# Patient Record
Sex: Female | Born: 1995 | Race: White | Hispanic: No | Marital: Married | Smoking: Never smoker
Health system: Southern US, Community
[De-identification: ages and names within clinical notes are randomized; demographics above are authoritative.]

## PROBLEM LIST (undated history)

## (undated) DIAGNOSIS — G43909 Migraine, unspecified, not intractable, without status migrainosus: Secondary | ICD-10-CM

## (undated) DIAGNOSIS — B009 Herpesviral infection, unspecified: Secondary | ICD-10-CM

## (undated) DIAGNOSIS — T7840XA Allergy, unspecified, initial encounter: Secondary | ICD-10-CM

## (undated) DIAGNOSIS — J45990 Exercise induced bronchospasm: Secondary | ICD-10-CM

## (undated) DIAGNOSIS — D649 Anemia, unspecified: Secondary | ICD-10-CM

## (undated) DIAGNOSIS — T753XXA Motion sickness, initial encounter: Secondary | ICD-10-CM

## (undated) DIAGNOSIS — Z973 Presence of spectacles and contact lenses: Secondary | ICD-10-CM

## (undated) HISTORY — DX: Allergy, unspecified, initial encounter: T78.40XA

## (undated) HISTORY — DX: Herpesviral infection, unspecified: B00.9

## (undated) HISTORY — PX: WISDOM TOOTH EXTRACTION: SHX21

---
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
## 2016-01-01 ENCOUNTER — Telehealth: Payer: Self-pay | Admitting: *Deleted

## 2016-01-01 ENCOUNTER — Encounter: Payer: Self-pay | Admitting: Podiatry

## 2016-01-01 MED ORDER — CEPHALEXIN 500 MG PO CAPS: 500.0000 mg | ORAL_CAPSULE | Freq: Four times a day (QID) | ORAL | 0 refills | Status: DC

## 2016-01-01 NOTE — Telephone Encounter (Signed)
Informed pt that Dr. Ardelle AntonWagoner changed the antibiotic to Keflex 500mg  #42 one capsule 3 times daily, and to make an appt to come in this week. Orders to pt and tranferred to schedulers.

## 2016-01-02 ENCOUNTER — Telehealth: Payer: Self-pay | Admitting: *Deleted

## 2016-01-02 ENCOUNTER — Encounter: Payer: Self-pay | Admitting: Podiatry

## 2016-01-02 ENCOUNTER — Ambulatory Visit (HOSPITAL_BASED_OUTPATIENT_CLINIC_OR_DEPARTMENT_OTHER)
Admission: RE | Admit: 2016-01-02 | Discharge: 2016-01-02 | Disposition: A | Discharge status: Home / Self Care | Payer: Managed Care, Other (non HMO) | Source: Ambulatory Visit | Attending: Podiatry | Admitting: Podiatry

## 2016-01-02 ENCOUNTER — Ambulatory Visit (INDEPENDENT_AMBULATORY_CARE_PROVIDER_SITE_OTHER): Payer: Managed Care, Other (non HMO) | Admitting: Podiatry

## 2016-01-02 VITALS — BP 107/69 | HR 94 | Temp 98.0°F | Resp 18

## 2016-01-02 DIAGNOSIS — A6920 Lyme disease, unspecified: Secondary | ICD-10-CM

## 2016-01-02 DIAGNOSIS — L03116 Cellulitis of left lower limb: Secondary | ICD-10-CM | POA: Diagnosis not present

## 2016-01-02 DIAGNOSIS — L02619 Cutaneous abscess of unspecified foot: Secondary | ICD-10-CM

## 2016-01-02 DIAGNOSIS — L03119 Cellulitis of unspecified part of limb: Principal | ICD-10-CM

## 2016-01-02 DIAGNOSIS — R52 Pain, unspecified: Secondary | ICD-10-CM | POA: Diagnosis not present

## 2016-01-02 MED ORDER — CIPROFLOXACIN HCL 500 MG PO TABS: 500.0000 mg | ORAL_TABLET | Freq: Two times a day (BID) | ORAL | 0 refills | Status: DC

## 2016-01-02 NOTE — Telephone Encounter (Signed)
Pt called and is scheduled to see Dr Ardelle [place]Wagoner [place] today.(10.31.17)

## 2016-01-02 NOTE — Progress Notes (Signed)
Subjective: 20 year old female presents the office of the grandmother for follow-up evaluation and continued redness Cindy Hart to her left foot. She states that the 2 days ago she was doing better. He has resolved almost completely however over the last day she started and is increase in swelling in her toes are becoming red and painful again. She is continuing with a surgical shoe. She called yesterday switch her over to Keflex and she stop the Bactrim. She states that she feels a little flush but she does not have any fevers or chills or any nausea or vomiting. She has no other complaints at this time.   Objective: AAO x3, NAD DP/PT pulses palpable bilaterally, CRT less than 3 seconds On the left foot there is edema and erythema to the forefoot encompassing digits 1-5 to the mid level of the metatarsal. There is no ascending cellulitis. There is diffuse tenderness mostly along the big toe along the lateral aspect. There is one area of what appears to be an area of puncture wound for possible bug bite which is healed over. There is no drainage or pus expressed. I'm unable to identify any areas of fluctuance or crepitus or any open sore otherwise. No open lesions or pre-ulcerative lesions.  No pain with calf compression, swelling, warmth, erythema  Assessment: Worsening cellulitis left foot  Plan: -All treatment options discussed with the patient including all alternatives, risks, complications.  -Repeat x-rays were obtained today. Unable to identify any osteomyelitis or soft tissue emphysema -Given longevity his symptoms as well as multiple antibiotics I recommended MRI of the left foot to rule out an abscess. This was ordered today to be done as soon as possible. -Will add Cipro to the Keflex. Discussed that there is any worsening of the erythema or swelling she is to go to the emergency room for IV antibiotics and she verbally understood this. -Also order blood work for Lyme disease as  they were concerned about this as well. -Follow up in 1 week or sooner if needed. -Patient encouraged to call the office with any questions, concerns, change in symptoms.   Cindy CurdMatthew Wagoner, DPM

## 2016-01-02 NOTE — Patient Instructions (Signed)
I ordered an MRI of your left foot with contrast to rule out an abscess. Please call [phone] and speak with Joya SanValery to follow-up with this.   I sent cipro to your pharmacy to start as well  I ordered a blood test to check for lyme disease.   If there is any worsening, go to the ER for likely IV antibiotics

## 2016-01-02 NOTE — Telephone Encounter (Addendum)
Dr. Ardelle AntonWagoner ordered stat MRI with and without contrast of Left foot evaluate abscess of forefoot.[employer] - Pre-cert dept.Wilmon Arms-Matthew K., states clinicals are required for pre-cert, I requested to speak with a Nurse Reviewer to give emergent clinicals, Wilmon ArmsMatthew K. States his prompts will not allow, must fax clinicals to [phone]. Faxed required information to [employer]. 01/04/2016-[employer] APPROVED MRI [id number] LEFT FOOT WITH/WITHOUT CONTRAST, AUTHORIZATION NUMBER: [id number], AUTHORIZATION EFFECTIVE DATES(S):  01/02/2016 THROUGH 04/01/2016. [facility]ax[facility] authorization to [facility]. Unable to leave message informing pt the MRI had been approved, "mailbox is full". Left message to call [facility]. 01/04/2016-Donna, pt's mtr states pt's foot worsened and had to go to the ER, [facility] at 2[facility]00[facility]. Left message requesting pt's current status and treatment received at ER. Dr. Ardelle AntonWagoner states he would like Dr Logan BoresEvans to see pt tomorrow for 2nd opinion. Unable to leave message for pt to make an appt for 01/05/2016 with Dr. Logan BoresEvans, mailbox was full. Left message on pt's mtr, Donna's voicemail to call to make an appt 12/05/2015 with Dr. Logan BoresEvans. Left message on pt's home phone to make an appt with Dr.Evans tomorrow.

## 2016-01-03 ENCOUNTER — Emergency Department
Admission: EM | Admit: 2016-01-03 | Discharge: 2016-01-03 | Disposition: A | Discharge status: Home / Self Care | Payer: Managed Care, Other (non HMO) | Attending: Emergency Medicine | Admitting: Emergency Medicine

## 2016-01-03 DIAGNOSIS — L03116 Cellulitis of left lower limb: Secondary | ICD-10-CM

## 2016-01-03 DIAGNOSIS — M7989 Other specified soft tissue disorders: Secondary | ICD-10-CM | POA: Diagnosis present

## 2016-01-03 DIAGNOSIS — J45909 Unspecified asthma, uncomplicated: Secondary | ICD-10-CM | POA: Diagnosis not present

## 2016-01-03 DIAGNOSIS — Z7951 Long term (current) use of inhaled steroids: Secondary | ICD-10-CM | POA: Insufficient documentation

## 2016-01-03 DIAGNOSIS — Z79899 Other long term (current) drug therapy: Secondary | ICD-10-CM | POA: Insufficient documentation

## 2016-01-03 LAB — CBC WITH DIFFERENTIAL/PLATELET
BASOS PCT: 1 %
Basophils Absolute: 0.1 10*3/uL (ref 0–0.1)
EOS ABS: 0.1 10*3/uL (ref 0–0.7)
Eosinophils Relative: 1 %
HEMATOCRIT: 33.3 % — AB (ref 35.0–47.0)
HEMOGLOBIN: 10.8 g/dL — AB (ref 12.0–16.0)
LYMPHS ABS: 1.9 10*3/uL (ref 1.0–3.6)
Lymphocytes Relative: 22 %
MCH: 23.2 pg — ABNORMAL LOW (ref 26.0–34.0)
MCHC: 32.4 g/dL (ref 32.0–36.0)
MCV: 71.4 fL — ABNORMAL LOW (ref 80.0–100.0)
MONOS PCT: 9 %
Monocytes Absolute: 0.7 10*3/uL (ref 0.2–0.9)
NEUTROS ABS: 5.8 10*3/uL (ref 1.4–6.5)
NEUTROS PCT: 67 %
Platelets: 244 10*3/uL (ref 150–440)
RBC: 4.66 MIL/uL (ref 3.80–5.20)
RDW: 16.1 % — ABNORMAL HIGH (ref 11.5–14.5)
WBC: 8.6 10*3/uL (ref 3.6–11.0)

## 2016-01-03 LAB — BASIC METABOLIC PANEL
ANION GAP: 5 (ref 5–15)
BUN: 8 mg/dL (ref 6–20)
CALCIUM: 8.8 mg/dL — AB (ref 8.9–10.3)
CHLORIDE: 105 mmol/L (ref 101–111)
CO2: 25 mmol/L (ref 22–32)
CREATININE: 0.73 mg/dL (ref 0.44–1.00)
GFR calc non Af Amer: >60 mL/min (ref >60)
Glucose, Bld: 88 mg/dL (ref 65–99)
Potassium: 4.1 mmol/L (ref 3.5–5.1)
SODIUM: 135 mmol/L (ref 135–145)

## 2016-01-03 MED ORDER — CLINDAMYCIN HCL 300 MG PO CAPS: 300.0000 mg | ORAL_CAPSULE | Freq: Three times a day (TID) | ORAL | 0 refills | Status: AC

## 2016-01-03 MED ORDER — CLINDAMYCIN PHOSPHATE 600 MG/50ML IV SOLN
600.0000 mg | Freq: Once | INTRAVENOUS | Status: AC
  Administered 2016-01-03: 600 mg via INTRAVENOUS
  Filled 2016-01-03: qty 50

## 2016-01-03 NOTE — ED Notes (Signed)
Patient here with cellulitis to right foot. Patient states this started 3 weeks ago. Unknown origin. Patient states that she may have gotten bit by something, small black dot noted to inside of right great toe. Edema and redness noted to right foot. Redness starts at the middle of the foot and extends into all digits. Patient able to ambulate with boot on affected foot. Patient was on 2 weeks of bactrim. Patient is now on cipro and keflex.

## 2016-01-03 NOTE — ED Provider Notes (Signed)
[facility] Emergency Department Provider Note ____________________________________________  Time seen: 15:26  I have reviewed the triage vital signs and the nursing notes.  HISTORY  Chief Complaint  Cellulitis  HPI Cindy Hart is a 20 y.o. female with no significant medical history, presents to the ED for continued swelling, pain, and redness to her left foot for three weeks with unknown origin.  Three weeks ago she woke up with redness, pain and edema in her first web space on her left foot. [facility] and [facility] prescribed her bactrim and prednisone.  Patient did not like the prednisone and went to her PCP.  Her PCP drew labs and the WBC count was 18 so she placed the patient was additionally on Keflex. She obtained an x-ray of her left foot and it came back unremarkable.  She stopped the Bactrim and was just taking the Keflex. The PCP referred the patient to see Dr. Ardelle AntonWagoner (podiatrist).  At her first visit with Dr. Ardelle AntonWagoner he took another x-ray and did more labs that all came back normal.  The WBC count came down to 10, but the redness and swelling had spread now to the rest of her toes and 1/3 way up her foot. Dr. Ardelle AntonWagoner told patient to continue Keflex and follow up in a week.  The redness, pain, and swelling got worse so patient went to see Dr. Ardelle AntonWagoner yesterday (01/02/16) and he placed her on Cipro on top of the keflex.  He told her to go to the ER if the redness and swelling got worse the next morning, which it did so she came to the ED today.  Patient denies any trauma, fever, rash, arthralgias, any exposure to insects or ticks, or ever experiencing this before.  Admits to warm to touch, swelling, erythema, and pain especially with walking.  Pain is a 7/10 intensity.   No past medical history on file.  Patient Active Problem List   Diagnosis Date Noted  . Cellulitis of left lower extremity 01/02/2016  . Asthma, intermittent 12/05/2014     No past surgical history on file.  Prior to Admission medications   Medication Sig Start Date End Date Taking? Authorizing Provider  cephALEXin (KEFLEX) 500 MG capsule Take 1 capsule (500 mg total) by mouth 4 (four) times daily. 01/01/16   Vivi BarrackMatthew R Wagoner, DPM  ciprofloxacin (CIPRO) 500 MG tablet Take 1 tablet (500 mg total) by mouth 2 (two) times daily. 01/02/16   Vivi BarrackMatthew R Wagoner, DPM  clindamycin (CLEOCIN) 300 MG capsule Take 1 capsule (300 mg total) by mouth 3 (three) times daily. 01/03/16 01/13/16  Damika Harmon V Bacon Matsue Strom, PA-C  fluticasone (FLONASE) 50 MCG/ACT nasal spray Place 2 sprays into both nostrils daily. 07/20/15   Faythe GheeSusan W Fisher, PA-C  Norgestimate-Ethinyl Estradiol Triphasic (TRI-PREVIFEM) 0.18/0.215/0.25 MG-35 MCG tablet Take by mouth. 08/24/14   Historical Provider, MD  sulfamethoxazole-trimethoprim (BACTRIM DS,SEPTRA DS) 800-160 MG tablet Take 1 tablet by mouth 2 (two) times daily. 12/28/15   Vivi BarrackMatthew R Wagoner, DPM  valACYclovir (VALTREX) 1000 MG tablet TAKE 1 TABLET BY MOUTH TWICE A DAY AS DIRECTED 10/10/14   Historical Provider, MD   Allergies Review of patient's allergies indicates no known allergies.  No family history on file.  Social History Social History  Substance Use Topics  . Smoking status: Never Smoker  . Smokeless tobacco: Never Used  . Alcohol use Not on file   Review of Systems  Constitutional: Negative for fever. Cardiovascular: Negative  for chest pain. Respiratory: Negative for shortness of breath or cough. Gastrointestinal: Negative for abdominal pain, vomiting and diarrhea. Musculoskeletal: Negative for back pain. Skin: admits to erythema and edema to left foot from the toes up to the middle of the foot.  Neurological: Negative for headaches, focal weakness or numbness. ____________________________________________  PHYSICAL EXAM:  VITAL SIGNS: ED Triage Vitals  Enc Vitals Group     BP 01/03/16 1255 (!) 131/91     Pulse Rate 01/03/16  1255 91     Resp 01/03/16 1255 18     Temp 01/03/16 1255 98.2 F (36.8 C)     Temp Source 01/03/16 1255 Oral     SpO2 01/03/16 1255 99 %     Weight 01/03/16 1256 125 lb (56.7 kg)     Height 01/03/16 1256 5\' 7"  (1.702 m)     Head Circumference --      Peak Flow --      Pain Score 01/03/16 1256 7     Pain Loc --      Pain Edu? --      Excl. in GC? --    Constitutional: Alert and oriented. Well appearing and in no distress. Head: Normocephalic and atraumatic. Cardiovascular: Normal rate, regular rhythm. Normal distal pulses. DP and PT 2+ BIL.  Respiratory: Normal respiratory effort. No wheezes/rales/rhonchi. Gastrointestinal: Soft and nontender. No distention. Musculoskeletal: Nontender with normal range of motion in all extremities.  Neurologic:  Normal gait without ataxia. Normal speech and language. No gross focal neurologic deficits are appreciated. Skin:  Erythematous and edematous left foot from the toes up to the middle of the foot. Warm to touch.  No clear demarcations. No clear entry point.  Psychiatric: Mood and affect are normal. Patient exhibits appropriate insight and judgment. ____________________________________________   LABS (pertinent positives/negatives) Labs Reviewed  BASIC METABOLIC PANEL - Abnormal; Notable for the following:       Result Value   Calcium 8.8 (*)    All other components within normal limits  CBC WITH DIFFERENTIAL/PLATELET - Abnormal; Notable for the following:    Hemoglobin 10.8 (*)    HCT 33.3 (*)    MCV 71.4 (*)    MCH 23.2 (*)    RDW 16.1 (*)    All other components within normal limits  B. BURGDORFI ANTIBODIES  ____________________________________________  PROCEDURES  Clindamycin 600mg  IVP  ____________________________________________  INITIAL IMPRESSION / ASSESSMENT AND PLAN / ED COURSE  Cindy Hart is a 20 year old female presenting to the ED with cellulitis to her left foot for three weeks.  She has been placed on bactrim,  keflex, and cipro with no relief of symptoms or improvement. She denies fever and trauma. Labs today showed no indication of an ongoing infection.  Educated patient to discontinue keflex and cipro and prescribed patient one dose of IV clindamycin 600 mg while in the ED and then discharge home with clindamycin 300mg  PO TID for 10 days.  Follow-up with podiatrist in the next week if symptoms do not improve or unless otherwise notified.   Clinical Course   ____________________________________________  FINAL CLINICAL IMPRESSION(S) / ED DIAGNOSES  Final diagnoses:  Cellulitis of foot, left     Lissa HoardJenise V Bacon Zuzu Befort, PA-C 01/03/16 1752    Loleta Roseory Forbach, MD 01/03/16 2012

## 2016-01-03 NOTE — ED Notes (Signed)
Pt verbalized understanding of discharge instructions. NAD at this time. 

## 2016-01-03 NOTE — ED Triage Notes (Signed)
Pt arrives ambulatory to triage with reports of left sided foot pain that has been ongoing for three weeks  She was seen by Greig RightSusan Fisher and started on abx and then she was seen by a specialist at [facility] and started on more abx but pt reports that her edema and pain is not any better

## 2016-01-03 NOTE — Discharge Instructions (Signed)
Labs show no indication of ongoing infection.  Take Clindamycin three times daily for ten days.  Follow up with Podiatry for close monitoring and for effectiveness of clindamycin.  Come back to the ED if the redness and swelling start to streak up the leg or if there is SEVERE pain in the left foot.

## 2016-01-04 ENCOUNTER — Ambulatory Visit (HOSPITAL_COMMUNITY)
Admission: RE | Admit: 2016-01-04 | Discharge: 2016-01-04 | Disposition: A | Discharge status: Home / Self Care | Payer: Managed Care, Other (non HMO) | Source: Ambulatory Visit | Attending: Podiatry | Admitting: Podiatry

## 2016-01-04 DIAGNOSIS — L03119 Cellulitis of unspecified part of limb: Secondary | ICD-10-CM | POA: Diagnosis present

## 2016-01-04 DIAGNOSIS — L02619 Cutaneous abscess of unspecified foot: Secondary | ICD-10-CM

## 2016-01-04 DIAGNOSIS — L03116 Cellulitis of left lower limb: Secondary | ICD-10-CM | POA: Insufficient documentation

## 2016-01-04 MED ORDER — GADOBENATE DIMEGLUMINE 529 MG/ML IV SOLN
11.0000 mL | Freq: Once | INTRAVENOUS | Status: AC | PRN
  Administered 2016-01-04: 11 mL via INTRAVENOUS

## 2016-01-04 NOTE — Telephone Encounter (Signed)
Can you have her see Dr. Logan BoresEvans tomorrow? I want a second opinion on this. Thanks.

## 2016-01-05 ENCOUNTER — Ambulatory Visit: Payer: Managed Care, Other (non HMO) | Admitting: Podiatry

## 2016-01-05 LAB — LYME, WESTERN BLOT, SERUM (REFLEXED)
IGG P18 AB.: ABSENT
IGG P28 AB.: ABSENT
IGG P30 AB.: ABSENT
IGG P58 AB.: ABSENT
IGG P93 AB.: ABSENT
IGM P23 AB.: ABSENT
IgG P23 Ab.: ABSENT
IgG P66 Ab.: ABSENT
LYME IGG WB: NEGATIVE
Lyme IgM Wb: POSITIVE — AB

## 2016-01-05 LAB — B. BURGDORFI ANTIBODIES: B burgdorferi Ab IgG+IgM: 1.72 {ISR} — ABNORMAL HIGH (ref 0.00–0.90)

## 2016-01-06 ENCOUNTER — Telehealth: Payer: Self-pay | Admitting: Emergency Medicine

## 2016-01-06 NOTE — Telephone Encounter (Signed)
Called pt [phone]) back due to positive Lyme disease test results.  Per Dr. Derrill KayGoodman, call in doxycycline 100mg  BID x 21 days total #42 NO REFILLS.  Pt made aware, [facility] [address] [phone].

## 2016-01-07 ENCOUNTER — Telehealth (HOSPITAL_BASED_OUTPATIENT_CLINIC_OR_DEPARTMENT_OTHER): Payer: Self-pay

## 2016-01-11 ENCOUNTER — Ambulatory Visit: Payer: Managed Care, Other (non HMO) | Admitting: Podiatry

## 2016-02-23 ENCOUNTER — Ambulatory Visit: Payer: Self-pay | Admitting: Physician Assistant

## 2016-02-23 ENCOUNTER — Encounter: Payer: Self-pay | Admitting: Physician Assistant

## 2016-02-23 VITALS — BP 100/70 | HR 90 | Temp 98.5°F | Ht 67.0 in | Wt 130.0 lb

## 2016-02-23 DIAGNOSIS — Z0000 Encounter for general adult medical examination without abnormal findings: Secondary | ICD-10-CM

## 2016-02-23 DIAGNOSIS — Z299 Encounter for prophylactic measures, unspecified: Secondary | ICD-10-CM

## 2016-02-23 NOTE — Progress Notes (Signed)
S: here for wellness check, states no complaints, says her foot infection ended up being lymes disease, was given 21 days of doxy, now foot is better, will only get red when gets out of the shower, ros neg  O: vitals wnl, nad, ENT wnl, neck supple no lymph, lungs c t a, cv rrr, abd soft nontender bs normal all 4 quads, neuro intact, gyn deferred to gynecologist  A: well adult  P: yearly screening labs

## 2016-02-24 LAB — CMP12+LP+TP+TSH+6AC+CBC/D/PLT
A/G RATIO: 1.7 (ref 1.2–2.2)
ALT: 27 IU/L (ref 0–32)
AST: 31 IU/L (ref 0–40)
Albumin: 4.1 g/dL (ref 3.5–5.5)
Alkaline Phosphatase: 68 IU/L (ref 39–117)
BASOS ABS: 0 10*3/uL (ref 0.0–0.2)
BILIRUBIN TOTAL: 0.3 mg/dL (ref 0.0–1.2)
BUN / CREAT RATIO: 8 — AB (ref 9–23)
BUN: 6 mg/dL (ref 6–20)
Basos: 1 %
CHOL/HDL RATIO: 3.6 ratio (ref 0.0–4.4)
Calcium: 8.9 mg/dL (ref 8.7–10.2)
Chloride: 105 mmol/L (ref 96–106)
Cholesterol, Total: 155 mg/dL (ref 100–169)
Creatinine, Ser: 0.74 mg/dL (ref 0.57–1.00)
EOS (ABSOLUTE): 0.1 10*3/uL (ref 0.0–0.4)
EOS: 2 %
Estimated CHD Risk: 0.6 times avg.
Free Thyroxine Index: 1.4 (ref 1.2–4.9)
GFR calc non Af Amer: 118 mL/min/{1.73_m2} (ref >59)
GFR, EST AFRICAN AMERICAN: 136 mL/min/{1.73_m2} (ref >59)
GGT: 7 IU/L (ref 0–60)
GLOBULIN, TOTAL: 2.4 g/dL (ref 1.5–4.5)
GLUCOSE: 80 mg/dL (ref 65–99)
HDL: 43 mg/dL (ref >39)
HEMOGLOBIN: 10.2 g/dL — AB (ref 11.1–15.9)
Hematocrit: 32.4 % — ABNORMAL LOW (ref 34.0–46.6)
IMMATURE GRANS (ABS): 0 10*3/uL (ref 0.0–0.1)
IMMATURE GRANULOCYTES: 0 %
IRON: 22 ug/dL — AB (ref 27–159)
LDH: 146 IU/L (ref 119–226)
LDL Calculated: 91 mg/dL (ref 0–109)
LYMPHS: 38 %
Lymphocytes Absolute: 2.6 10*3/uL (ref 0.7–3.1)
MCH: 21.7 pg — ABNORMAL LOW (ref 26.6–33.0)
MCHC: 31.5 g/dL (ref 31.5–35.7)
MCV: 69 fL — ABNORMAL LOW (ref 79–97)
MONOCYTES: 8 %
Monocytes Absolute: 0.6 10*3/uL (ref 0.1–0.9)
Neutrophils Absolute: 3.5 10*3/uL (ref 1.4–7.0)
Neutrophils: 51 %
PHOSPHORUS: 3.2 mg/dL (ref 2.5–5.3)
PLATELETS: 279 10*3/uL (ref 150–379)
Potassium: 4.1 mmol/L (ref 3.5–5.2)
RBC: 4.71 x10E6/uL (ref 3.77–5.28)
RDW: 15.8 % — AB (ref 12.3–15.4)
Sodium: 141 mmol/L (ref 134–144)
T3 UPTAKE RATIO: 18 % — AB (ref 24–39)
T4 TOTAL: 7.7 ug/dL (ref 4.5–12.0)
TRIGLYCERIDES: 104 mg/dL — AB (ref 0–89)
TSH: 3.54 u[IU]/mL (ref 0.450–4.500)
Total Protein: 6.5 g/dL (ref 6.0–8.5)
URIC ACID: 4 mg/dL (ref 2.5–7.1)
VLDL CHOLESTEROL CAL: 21 mg/dL (ref 5–40)
WBC: 6.8 10*3/uL (ref 3.4–10.8)

## 2016-02-24 LAB — VITAMIN D 25 HYDROXY (VIT D DEFICIENCY, FRACTURES): VIT D 25 HYDROXY: 42.9 ng/mL (ref 30.0–100.0)

## 2016-03-04 DIAGNOSIS — D649 Anemia, unspecified: Secondary | ICD-10-CM

## 2016-03-04 HISTORY — DX: Anemia, unspecified: D64.9

## 2016-03-21 ENCOUNTER — Other Ambulatory Visit: Payer: Self-pay

## 2016-03-22 ENCOUNTER — Ambulatory Visit: Payer: Self-pay | Admitting: Physician Assistant

## 2016-03-22 ENCOUNTER — Encounter: Payer: Self-pay | Admitting: Physician Assistant

## 2016-03-22 ENCOUNTER — Other Ambulatory Visit: Payer: Self-pay | Admitting: Emergency Medicine

## 2016-03-22 DIAGNOSIS — E611 Iron deficiency: Secondary | ICD-10-CM

## 2016-03-22 MED ORDER — VALACYCLOVIR HCL 1 G PO TABS: ORAL_TABLET | ORAL | 0 refills | Status: DC

## 2016-03-22 NOTE — Progress Notes (Signed)
Patient came in to have her blood rechecked per Susan's authorization.  Patient also expressed that she needs to get a refill for her Valtrex.

## 2016-03-23 LAB — CBC WITH DIFFERENTIAL/PLATELET
BASOS ABS: 0.1 10*3/uL (ref 0.0–0.2)
Basos: 1 %
EOS (ABSOLUTE): 0.1 10*3/uL (ref 0.0–0.4)
EOS: 2 %
HEMATOCRIT: 34.7 % (ref 34.0–46.6)
Hemoglobin: 10.6 g/dL — ABNORMAL LOW (ref 11.1–15.9)
Immature Grans (Abs): 0 10*3/uL (ref 0.0–0.1)
Immature Granulocytes: 0 %
LYMPHS ABS: 2 10*3/uL (ref 0.7–3.1)
Lymphs: 26 %
MCH: 22.3 pg — ABNORMAL LOW (ref 26.6–33.0)
MCHC: 30.5 g/dL — AB (ref 31.5–35.7)
MCV: 73 fL — ABNORMAL LOW (ref 79–97)
MONOS ABS: 0.7 10*3/uL (ref 0.1–0.9)
Monocytes: 9 %
NEUTROS PCT: 62 %
Neutrophils Absolute: 4.8 10*3/uL (ref 1.4–7.0)
PLATELETS: 324 10*3/uL (ref 150–379)
RBC: 4.76 x10E6/uL (ref 3.77–5.28)
RDW: 16.7 % — AB (ref 12.3–15.4)
WBC: 7.7 10*3/uL (ref 3.4–10.8)

## 2016-03-23 LAB — IRON: Iron: 20 ug/dL — ABNORMAL LOW (ref 27–159)

## 2016-03-27 NOTE — Progress Notes (Signed)
Per Susan's authorization I referred the patient to [facility].

## 2016-03-27 NOTE — Addendum Note (Signed)
Addended by: Catha BrowEACON, MONIQUE T on: 03/27/2016 02:56 PM   Modules accepted: Orders

## 2016-04-10 DIAGNOSIS — D509 Iron deficiency anemia, unspecified: Secondary | ICD-10-CM | POA: Insufficient documentation

## 2016-04-10 NOTE — Progress Notes (Signed)
[facility]  Telephone:(336) [phone] Fax:(336) [phone]  ID: Cindy Hart OB: 03/22/1995  MR#: [id number]  ION#:[id number]#:[id number]  Patient Care Team: Bartholomew BoardsSusan W Fisher, PA-C as PCP - General (Physician Assistant)  CHIEF COMPLAINT:  Iron deficiency anemia.  INTERVAL HISTORY: Patient is a 21 year old female who was noted to have significant iron deficiency anemia on routine blood work during workup for Lyme disease. She has increased weakness and fatigue, particularly towards the end of the day. She also has noted some dyspnea on exertion. She otherwise feels well. She has no neurologic complaints. She denies any recent fevers or illnesses. She denies any chest pain or hemoptysis. She has no nausea, vomiting, constipation, or diarrhea. She denies any melena or hematochezia. She has no urinary complaints. Patient otherwise feels well and offers no further specific complaints.  REVIEW OF SYSTEMS:   Review of Systems  Constitutional: Positive for malaise/fatigue. Negative for fever and weight loss.  Respiratory: Positive for shortness of breath. Negative for cough and hemoptysis.   Cardiovascular: Negative.  Negative for chest pain and leg swelling.  Gastrointestinal: Negative.  Negative for abdominal pain and blood in stool.  Genitourinary: Negative.   Musculoskeletal: Negative.   Neurological: Positive for weakness.  Psychiatric/Behavioral: Negative.  The patient is not nervous/anxious.     As per HPI. Otherwise, a complete review of systems is negative.  PAST MEDICAL HISTORY: Past Medical History:  Diagnosis Date  . HSV-1 (herpes simplex virus 1) infection     PAST SURGICAL HISTORY: History reviewed. No pertinent surgical history.  FAMILY HISTORY: Family History  Problem Relation Age of Onset  . Thyroid disease Mother   . Diabetes Maternal Grandmother   . Heart disease Maternal Grandmother   . Hypertension Maternal Grandfather   . Atrial fibrillation Paternal  Grandmother     ADVANCED DIRECTIVES (Y/N):  N  HEALTH MAINTENANCE: Social History  Substance Use Topics  . Smoking status: Never Smoker  . Smokeless tobacco: Never Used  . Alcohol use No     Colonoscopy:  PAP:  Bone density:  Lipid panel:  No Known Allergies  Current Outpatient Prescriptions  Medication Sig Dispense Refill  . Ferrous Sulfate (IRON) 325 (65 Fe) MG TABS Take by mouth.    . Multiple Vitamins-Minerals (MULTIVITAMIN ADULTS PO) Take by mouth.    Lorita Officer. Norgestim-Eth Estrad Triphasic (TRI-PREVIFEM PO) Take by mouth.    . L-Lysine 1000 MG TABS Take 1 tablet (1,000 mg total) by mouth daily. (Patient not taking: Reported on 04/11/2016) 28 tablet 11  . valACYclovir (VALTREX) 1000 MG tablet TAKE 1 TABLET BY MOUTH TWICE A DAY AS DIRECTED (Patient not taking: Reported on 04/11/2016) 20 tablet 0   No current facility-administered medications for this visit.     OBJECTIVE: Vitals:   04/11/16 1505  BP: 124/75  Resp: 18  Temp: 98.7 F (37.1 C)     Body mass index is 20.87 kg/m.    ECOG FS:0 - Asymptomatic  General: Well-developed, well-nourished, no acute distress. Eyes: Pink conjunctiva, anicteric sclera. HEENT: Normocephalic, moist mucous membranes, clear oropharnyx. Lungs: Clear to auscultation bilaterally. Heart: Regular rate and rhythm. No rubs, murmurs, or gallops. Abdomen: Soft, nontender, nondistended. No organomegaly noted, normoactive bowel sounds. Musculoskeletal: No edema, cyanosis, or clubbing. Neuro: Alert, answering all questions appropriately. Cranial nerves grossly intact. Skin: No rashes or petechiae noted. Psych: Normal affect. Lymphatics: No cervical, calvicular, axillary or inguinal LAD.   LAB RESULTS:  Lab Results  Component Value Date   NA 141 02/23/2016  K 4.1 02/23/2016   CL 105 02/23/2016   CO2 25 01/03/2016   GLUCOSE 80 02/23/2016   BUN 6 02/23/2016   CREATININE 0.74 02/23/2016   CALCIUM 8.9 02/23/2016   PROT 6.5 02/23/2016    ALBUMIN 4.1 02/23/2016   AST 31 02/23/2016   ALT 27 02/23/2016   ALKPHOS 68 02/23/2016   BILITOT 0.3 02/23/2016   GFRNONAA 118 02/23/2016   GFRAA 136 02/23/2016    Lab Results  Component Value Date   WBC 7.7 03/22/2016   NEUTROABS 4.8 03/22/2016   HGB 10.8 (L) 01/03/2016   HCT 34.7 03/22/2016   MCV 73 (L) 03/22/2016   PLT 324 03/22/2016   Lab Results  Component Value Date   IRON 20 (L) 03/22/2016   No results found for: FERRITIN  STUDIES: No results found.  ASSESSMENT: Iron deficiency anemia.  PLAN:    1.  Iron deficiency anemia:  Patient's hemoglobin, MCV, and iron stores are decreased and she is symptomatic. She will benefit from IV iron. Return to clinic in 1 and 2 weeks to receive 510 mg of IV Feraheme. Patient will then return to clinic in 3 months with repeat laboratory work and further evaluation. If she does not have an improvement in her hemoglobin at that point, will consider full anemia workup. 2. Lyme disease: Continue treatment and monitoring her primary care.  Patient expressed understanding and was in agreement with this plan. She also understands that She can call clinic at any time with any questions, concerns, or complaints.    Jeralyn Ruths, MD   04/14/2016 8:28 PM

## 2016-04-11 ENCOUNTER — Encounter: Payer: Self-pay | Admitting: Oncology

## 2016-04-11 ENCOUNTER — Encounter: Payer: Self-pay | Admitting: Obstetrics and Gynecology

## 2016-04-11 ENCOUNTER — Ambulatory Visit (INDEPENDENT_AMBULATORY_CARE_PROVIDER_SITE_OTHER): Payer: Managed Care, Other (non HMO) | Admitting: Obstetrics and Gynecology

## 2016-04-11 ENCOUNTER — Inpatient Hospital Stay: Payer: Managed Care, Other (non HMO) | Attending: Oncology | Admitting: Oncology

## 2016-04-11 ENCOUNTER — Other Ambulatory Visit: Payer: Self-pay | Admitting: Obstetrics and Gynecology

## 2016-04-11 VITALS — BP 108/68 | HR 88 | Ht 67.0 in | Wt 132.0 lb

## 2016-04-11 DIAGNOSIS — B009 Herpesviral infection, unspecified: Secondary | ICD-10-CM | POA: Insufficient documentation

## 2016-04-11 DIAGNOSIS — Z01419 Encounter for gynecological examination (general) (routine) without abnormal findings: Secondary | ICD-10-CM | POA: Diagnosis not present

## 2016-04-11 DIAGNOSIS — Z79899 Other long term (current) drug therapy: Secondary | ICD-10-CM | POA: Diagnosis not present

## 2016-04-11 DIAGNOSIS — A6920 Lyme disease, unspecified: Secondary | ICD-10-CM | POA: Diagnosis not present

## 2016-04-11 DIAGNOSIS — D509 Iron deficiency anemia, unspecified: Secondary | ICD-10-CM | POA: Diagnosis not present

## 2016-04-11 DIAGNOSIS — D508 Other iron deficiency anemias: Secondary | ICD-10-CM

## 2016-04-11 MED ORDER — L-LYSINE 1000 MG PO TABS: 1.0000 | ORAL_TABLET | Freq: Every day | ORAL | 11 refills | Status: DC

## 2016-04-11 NOTE — Progress Notes (Signed)
   Subjective:     Cindy Hart is a 21 y.o. female and is here for a comprehensive physical exam. The patient reports problems - menses short but heavy on current pills, currently being treated and evaluated for anemia.  Social History   Social History  . Marital status: Single    Spouse name: N/A  . Number of children: N/A  . Years of education: N/A   Occupational History  . Not on file.   Social History Main Topics  . Smoking status: Never Smoker  . Smokeless tobacco: Never Used  . Alcohol use No  . Drug use: No  . Sexual activity: Yes    Birth control/ protection: Pill   Other Topics Concern  . Not on file   Social History Narrative  . No narrative on file   Health Maintenance  Topic Date Due  . CHLAMYDIA SCREENING  12/04/2011  . HIV Screening  12/04/2011  . INFLUENZA VACCINE  10/03/2015  . TETANUS/TDAP  12/04/2015    The following portions of the patient's history were reviewed and updated as appropriate: allergies, current medications, past family history, past medical history, past social history, past surgical history and problem list.  Review of Systems Pertinent items noted in HPI and remainder of comprehensive ROS otherwise negative.   Objective:    General appearance: alert, cooperative and appears stated age Neck: no adenopathy, no carotid bruit, no JVD, supple, symmetrical, trachea midline and thyroid not enlarged, symmetric, no tenderness/mass/nodules Lungs: clear to auscultation bilaterally Breasts: normal appearance, no masses or tenderness Heart: regular rate and rhythm, S1, S2 normal, no murmur, click, rub or gallop Abdomen: soft, non-tender; bowel sounds normal; no masses,  no organomegaly Pelvic: cervix normal in appearance, external genitalia normal, no adnexal masses or tenderness, no cervical motion tenderness, rectovaginal septum normal, uterus normal size, shape, and consistency and vagina normal without discharge    Assessment:    Healthy female exam. Menorrhagia, anemia- HSV1     Plan:  Switched BC to [name] (sample given) L-Lysine daily to prevent mouth ulcers Will discuss different iron supplements with hemotologist as she has an appointment today. RTC 1 year or as needed.  Cindy Hart Cindy Hart, CNM   See After Visit Summary for Counseling Recommendations

## 2016-04-11 NOTE — Patient Instructions (Signed)
Place annual gynecologic exam patient instructions here.

## 2016-04-18 ENCOUNTER — Other Ambulatory Visit: Payer: Self-pay | Admitting: Oncology

## 2016-04-19 ENCOUNTER — Inpatient Hospital Stay: Payer: Managed Care, Other (non HMO)

## 2016-04-19 VITALS — BP 106/52 | HR 95 | Temp 99.0°F | Resp 18

## 2016-04-19 DIAGNOSIS — D509 Iron deficiency anemia, unspecified: Secondary | ICD-10-CM | POA: Diagnosis not present

## 2016-04-19 DIAGNOSIS — D508 Other iron deficiency anemias: Secondary | ICD-10-CM

## 2016-04-19 MED ORDER — SODIUM CHLORIDE 0.9 % IV SOLN
Freq: Once | INTRAVENOUS | Status: AC
  Administered 2016-04-19: 14:00:00 via INTRAVENOUS
  Filled 2016-04-19: qty 1000

## 2016-04-19 MED ORDER — DIPHENHYDRAMINE HCL 50 MG/ML IJ SOLN
25.0000 mg | Freq: Once | INTRAMUSCULAR | Status: AC
  Administered 2016-04-19: 25 mg via INTRAVENOUS

## 2016-04-19 MED ORDER — SODIUM CHLORIDE 0.9 % IV SOLN
510.0000 mg | Freq: Once | INTRAVENOUS | Status: AC
  Administered 2016-04-19: 510 mg via INTRAVENOUS
  Filled 2016-04-19: qty 17

## 2016-04-19 NOTE — Progress Notes (Signed)
1404 - Patient skin color red, pt. C/O feeling SOB.  Infusion stopped and MD notified.  Pulse ox. 98% on RA.   1408- Skin color not as red.  Patient states feeling much better.  Denies any SOB.  C/O itching.

## 2016-04-24 ENCOUNTER — Telehealth: Payer: Self-pay | Admitting: *Deleted

## 2016-04-24 NOTE — Telephone Encounter (Signed)
Order was entered on Friday per Dr Orlie DakinFinnegan to cancel appt 2/23 and in May and to see him week of 2/26. I spoke with Colette regarding this and she will fix schedule error she made and call patient

## 2016-04-24 NOTE — Telephone Encounter (Signed)
CAlled confused because after her reaction to iron she was told her appt this Friday and for May would be cancelled and she would see MD to discuss change in therapy. She got a call to come for an iron infusion next Friday ( the same drug, Ferra heme ) Asking if this is to be what she gets and is asking for explanation. Please advise

## 2016-04-26 ENCOUNTER — Ambulatory Visit: Payer: Self-pay

## 2016-05-01 ENCOUNTER — Other Ambulatory Visit: Payer: Self-pay | Admitting: Oncology

## 2016-05-01 NOTE — Progress Notes (Signed)
[facility]  Telephone:(336) [phone] Fax:(336) [phone]  ID: Mertha FindersBrooke N Duclos OB: 02/16/1996  MR#: [id number]  OZD#:[id number]#:[id number]  Patient Care Team: Bartholomew BoardsSusan W Fisher, PA-C as PCP - General (Physician Assistant)  CHIEF COMPLAINT:  Iron deficiency anemia.  INTERVAL HISTORY: Patient returns to clinic today for further evaluation and reinitiation of IV iron. She had a reaction to Feraheme approximately 2 weeks ago and it was subsequently discontinued. She continues to feel weak and fatigued, but otherwise feels well. She also has noted some dyspnea on exertion. She has no neurologic complaints. She denies any recent fevers or illnesses. She denies any chest pain or hemoptysis. She has no nausea, vomiting, constipation, or diarrhea. She denies any melena or hematochezia. She has no urinary complaints. Patient offers no further specific complaints.  REVIEW OF SYSTEMS:   Review of Systems  Constitutional: Positive for malaise/fatigue. Negative for fever and weight loss.  Respiratory: Positive for shortness of breath. Negative for cough and hemoptysis.   Cardiovascular: Negative.  Negative for chest pain and leg swelling.  Gastrointestinal: Negative.  Negative for abdominal pain and blood in stool.  Genitourinary: Negative.   Musculoskeletal: Negative.   Neurological: Positive for weakness.  Psychiatric/Behavioral: Negative.  The patient is not nervous/anxious.     As per HPI. Otherwise, a complete review of systems is negative.  PAST MEDICAL HISTORY: Past Medical History:  Diagnosis Date  . HSV-1 (herpes simplex virus 1) infection     PAST SURGICAL HISTORY: No past surgical history on file.  FAMILY HISTORY: Family History  Problem Relation Age of Onset  . Thyroid disease Mother   . Diabetes Maternal Grandmother   . Heart disease Maternal Grandmother   . Hypertension Maternal Grandfather   . Atrial fibrillation Paternal Grandmother     ADVANCED DIRECTIVES (Y/N):   N  HEALTH MAINTENANCE: Social History  Substance Use Topics  . Smoking status: Never Smoker  . Smokeless tobacco: Never Used  . Alcohol use No     Colonoscopy:  PAP:  Bone density:  Lipid panel:  Allergies  Allergen Reactions  . Feraheme [Ferumoxytol] Anaphylaxis    Current Outpatient Prescriptions  Medication Sig Dispense Refill  . Ferrous Sulfate (IRON) 325 (65 Fe) MG TABS Take by mouth.    . Multiple Vitamins-Minerals (MULTIVITAMIN ADULTS PO) Take by mouth.    Lorita Officer. Norgestim-Eth Estrad Triphasic (TRI-PREVIFEM PO) Take by mouth.    . L-Lysine 1000 MG TABS Take 1 tablet (1,000 mg total) by mouth daily. (Patient not taking: Reported on 04/11/2016) 28 tablet 11  . Norethin Ace-Eth Estrad-FE (TAYTULLA) 1-20 MG-MCG(24) CAPS Take 1 capsule by mouth daily. 28 capsule 4  . valACYclovir (VALTREX) 1000 MG tablet TAKE 1 TABLET BY MOUTH TWICE A DAY AS DIRECTED (Patient not taking: Reported on 04/11/2016) 20 tablet 0   No current facility-administered medications for this visit.     OBJECTIVE: Vitals:   05/02/16 1355  BP: 117/78  Pulse: 94  Resp: 18     Body mass index is 21.79 kg/m.    ECOG FS:0 - Asymptomatic  General: Well-developed, well-nourished, no acute distress. Eyes: Pink conjunctiva, anicteric sclera. Lungs: Clear to auscultation bilaterally. Heart: Regular rate and rhythm. No rubs, murmurs, or gallops. Abdomen: Soft, nontender, nondistended. No organomegaly noted, normoactive bowel sounds. Musculoskeletal: No edema, cyanosis, or clubbing. Neuro: Alert, answering all questions appropriately. Cranial nerves grossly intact. Skin: No rashes or petechiae noted. Psych: Normal affect.   LAB RESULTS:  Lab Results  Component Value Date   NA  141 02/23/2016   K 4.1 02/23/2016   CL 105 02/23/2016   CO2 25 01/03/2016   GLUCOSE 80 02/23/2016   BUN 6 02/23/2016   CREATININE 0.74 02/23/2016   CALCIUM 8.9 02/23/2016   PROT 6.5 02/23/2016   ALBUMIN 4.1 02/23/2016   AST 31  02/23/2016   ALT 27 02/23/2016   ALKPHOS 68 02/23/2016   BILITOT 0.3 02/23/2016   GFRNONAA 118 02/23/2016   GFRAA 136 02/23/2016    Lab Results  Component Value Date   WBC 7.7 03/22/2016   NEUTROABS 4.8 03/22/2016   HGB 10.8 (L) 01/03/2016   HCT 34.7 03/22/2016   MCV 73 (L) 03/22/2016   PLT 324 03/22/2016   Lab Results  Component Value Date   IRON 20 (L) 03/22/2016   No results found for: FERRITIN  STUDIES: No results found.  ASSESSMENT: Iron deficiency anemia.  PLAN:    1.  Iron deficiency anemia:  Patient's hemoglobin, MCV, and iron stores are decreased and she is symptomatic. She will benefit from IV iron. Unfortunately, she had a reaction to [facility] and it was discontinued. After lengthy discussion with the patient and her mother, she has agreed to proceed with IV Venofer with heavy premedications. She expressed understanding that there is mild cross-reactivity between the 2 medications. If patient tolerates treatment she will return to clinic weekly for 4 weeks to receive 200 mg of IV Venofer. She will then return to clinic in 3 months, or 2 months after her last infusion, for further evaluation.  If she does not have an improvement in her hemoglobin at that point, will consider full anemia workup. 2. Lyme disease: Continue treatment and monitoring her primary care.  Approximately 30 minutes was spent in discussion of which greater than 50% was consultation.  Patient expressed understanding and was in agreement with this plan. She also understands that She can call clinic at any time with any questions, concerns, or complaints.    Jeralyn Ruths, MD   05/06/2016 11:55 AM

## 2016-05-02 ENCOUNTER — Inpatient Hospital Stay: Payer: Managed Care, Other (non HMO) | Attending: Oncology | Admitting: Oncology

## 2016-05-02 ENCOUNTER — Encounter: Payer: Self-pay | Admitting: Obstetrics and Gynecology

## 2016-05-02 ENCOUNTER — Other Ambulatory Visit: Payer: Self-pay | Admitting: *Deleted

## 2016-05-02 ENCOUNTER — Inpatient Hospital Stay: Payer: Managed Care, Other (non HMO)

## 2016-05-02 VITALS — BP 102/62 | HR 83 | Resp 18

## 2016-05-02 VITALS — BP 117/78 | HR 94 | Resp 18 | Wt 139.1 lb

## 2016-05-02 DIAGNOSIS — D509 Iron deficiency anemia, unspecified: Secondary | ICD-10-CM | POA: Diagnosis not present

## 2016-05-02 DIAGNOSIS — B009 Herpesviral infection, unspecified: Secondary | ICD-10-CM | POA: Diagnosis not present

## 2016-05-02 DIAGNOSIS — Z79899 Other long term (current) drug therapy: Secondary | ICD-10-CM

## 2016-05-02 DIAGNOSIS — A6920 Lyme disease, unspecified: Secondary | ICD-10-CM | POA: Diagnosis not present

## 2016-05-02 DIAGNOSIS — D508 Other iron deficiency anemias: Secondary | ICD-10-CM

## 2016-05-02 MED ORDER — FAMOTIDINE IN NACL 20-0.9 MG/50ML-% IV SOLN: 20.0000 mg | Freq: Two times a day (BID) | INTRAVENOUS | Status: DC

## 2016-05-02 MED ORDER — SODIUM CHLORIDE 0.9 % IV SOLN
Freq: Once | INTRAVENOUS | Status: AC
  Administered 2016-05-02: 15:00:00 via INTRAVENOUS
  Filled 2016-05-02: qty 1000

## 2016-05-02 MED ORDER — FAMOTIDINE IN NACL 20-0.9 MG/50ML-% IV SOLN
20.0000 mg | Freq: Once | INTRAVENOUS | Status: AC
  Administered 2016-05-02: 20 mg via INTRAVENOUS
  Filled 2016-05-02: qty 50

## 2016-05-02 MED ORDER — DIPHENHYDRAMINE HCL 50 MG/ML IJ SOLN: 25.0000 mg | Freq: Once | INTRAMUSCULAR | Status: DC

## 2016-05-02 MED ORDER — SODIUM CHLORIDE 0.9 % IV SOLN: 200.0000 mg | Freq: Once | INTRAVENOUS | Status: DC

## 2016-05-02 MED ORDER — DEXAMETHASONE SODIUM PHOSPHATE 10 MG/ML IJ SOLN
10.0000 mg | Freq: Once | INTRAMUSCULAR | Status: AC
  Administered 2016-05-02: 10 mg via INTRAVENOUS
  Filled 2016-05-02: qty 1

## 2016-05-02 MED ORDER — ACETAMINOPHEN 325 MG PO TABS: 650.0000 mg | ORAL_TABLET | Freq: Once | ORAL | Status: DC

## 2016-05-02 MED ORDER — IRON SUCROSE 20 MG/ML IV SOLN
200.0000 mg | Freq: Once | INTRAVENOUS | Status: AC
  Administered 2016-05-02: 200 mg via INTRAVENOUS
  Filled 2016-05-02: qty 10

## 2016-05-02 MED ORDER — NORETHIN ACE-ETH ESTRAD-FE 1-20 MG-MCG(24) PO CAPS: 1.0000 | ORAL_CAPSULE | Freq: Every day | ORAL | 4 refills | Status: DC

## 2016-05-02 MED ORDER — DIPHENHYDRAMINE HCL 50 MG/ML IJ SOLN
25.0000 mg | Freq: Once | INTRAMUSCULAR | Status: AC
  Administered 2016-05-02: 25 mg via INTRAVENOUS
  Filled 2016-05-02: qty 1

## 2016-05-02 MED ORDER — ACETAMINOPHEN 325 MG PO TABS
650.0000 mg | ORAL_TABLET | Freq: Once | ORAL | Status: AC
  Administered 2016-05-02: 650 mg via ORAL
  Filled 2016-05-02: qty 2

## 2016-05-02 MED ORDER — SODIUM CHLORIDE 0.9 % IV SOLN: 10.0000 mg | Freq: Once | INTRAVENOUS | Status: DC

## 2016-05-02 NOTE — Progress Notes (Signed)
Patient was having achiness yesterday.  Also has joint pain.  Formerly diagnosed with Lyme's disease.

## 2016-05-03 ENCOUNTER — Ambulatory Visit: Payer: Self-pay

## 2016-05-09 ENCOUNTER — Inpatient Hospital Stay: Payer: Managed Care, Other (non HMO)

## 2016-05-09 VITALS — BP 110/73 | HR 85 | Temp 98.6°F | Resp 18

## 2016-05-09 DIAGNOSIS — D508 Other iron deficiency anemias: Secondary | ICD-10-CM

## 2016-05-09 DIAGNOSIS — D509 Iron deficiency anemia, unspecified: Secondary | ICD-10-CM | POA: Diagnosis not present

## 2016-05-09 MED ORDER — DEXAMETHASONE SODIUM PHOSPHATE 10 MG/ML IJ SOLN
10.0000 mg | Freq: Once | INTRAMUSCULAR | Status: AC
  Administered 2016-05-09: 10 mg via INTRAVENOUS
  Filled 2016-05-09: qty 1

## 2016-05-09 MED ORDER — DIPHENHYDRAMINE HCL 50 MG/ML IJ SOLN
25.0000 mg | Freq: Once | INTRAMUSCULAR | Status: AC
  Administered 2016-05-09: 25 mg via INTRAVENOUS
  Filled 2016-05-09: qty 1

## 2016-05-09 MED ORDER — ACETAMINOPHEN 325 MG PO TABS
650.0000 mg | ORAL_TABLET | Freq: Once | ORAL | Status: AC
  Administered 2016-05-09: 650 mg via ORAL
  Filled 2016-05-09: qty 2

## 2016-05-09 MED ORDER — FAMOTIDINE IN NACL 20-0.9 MG/50ML-% IV SOLN
20.0000 mg | Freq: Two times a day (BID) | INTRAVENOUS | Status: DC
  Administered 2016-05-09: 20 mg via INTRAVENOUS
  Filled 2016-05-09: qty 50

## 2016-05-09 MED ORDER — SODIUM CHLORIDE 0.9 % IV SOLN: 10.0000 mg | Freq: Once | INTRAVENOUS | Status: DC

## 2016-05-09 MED ORDER — IRON SUCROSE 20 MG/ML IV SOLN
200.0000 mg | Freq: Once | INTRAVENOUS | Status: AC
  Administered 2016-05-09: 200 mg via INTRAVENOUS
  Filled 2016-05-09: qty 10

## 2016-05-09 MED ORDER — SODIUM CHLORIDE 0.9 % IV SOLN
Freq: Once | INTRAVENOUS | Status: AC
  Administered 2016-05-09: 14:00:00 via INTRAVENOUS
  Filled 2016-05-09: qty 1000

## 2016-05-09 MED ORDER — SODIUM CHLORIDE 0.9 % IV SOLN: 200.0000 mg | Freq: Once | INTRAVENOUS | Status: DC

## 2016-05-16 ENCOUNTER — Inpatient Hospital Stay: Payer: Managed Care, Other (non HMO)

## 2016-05-23 ENCOUNTER — Inpatient Hospital Stay: Payer: Managed Care, Other (non HMO)

## 2016-05-23 VITALS — BP 107/65 | HR 90 | Resp 20

## 2016-05-23 DIAGNOSIS — D509 Iron deficiency anemia, unspecified: Secondary | ICD-10-CM | POA: Diagnosis not present

## 2016-05-23 DIAGNOSIS — D508 Other iron deficiency anemias: Secondary | ICD-10-CM

## 2016-05-23 MED ORDER — SODIUM CHLORIDE 0.9 % IV SOLN: 10.0000 mg | Freq: Once | INTRAVENOUS | Status: DC

## 2016-05-23 MED ORDER — DIPHENHYDRAMINE HCL 50 MG/ML IJ SOLN
25.0000 mg | Freq: Once | INTRAMUSCULAR | Status: AC
  Administered 2016-05-23: 25 mg via INTRAVENOUS
  Filled 2016-05-23: qty 1

## 2016-05-23 MED ORDER — FAMOTIDINE IN NACL 20-0.9 MG/50ML-% IV SOLN
20.0000 mg | Freq: Two times a day (BID) | INTRAVENOUS | Status: DC
  Administered 2016-05-23: 20 mg via INTRAVENOUS
  Filled 2016-05-23: qty 50

## 2016-05-23 MED ORDER — DEXAMETHASONE SODIUM PHOSPHATE 10 MG/ML IJ SOLN
10.0000 mg | Freq: Once | INTRAMUSCULAR | Status: AC
  Administered 2016-05-23: 10 mg via INTRAVENOUS

## 2016-05-23 MED ORDER — ACETAMINOPHEN 325 MG PO TABS
650.0000 mg | ORAL_TABLET | Freq: Once | ORAL | Status: AC
  Administered 2016-05-23: 650 mg via ORAL
  Filled 2016-05-23: qty 2

## 2016-05-23 MED ORDER — SODIUM CHLORIDE 0.9 % IV SOLN
Freq: Once | INTRAVENOUS | Status: AC
  Administered 2016-05-23: 14:00:00 via INTRAVENOUS
  Filled 2016-05-23: qty 1000

## 2016-05-23 MED ORDER — IRON SUCROSE 20 MG/ML IV SOLN
200.0000 mg | Freq: Once | INTRAVENOUS | Status: AC
  Administered 2016-05-23: 200 mg via INTRAVENOUS
  Filled 2016-05-23: qty 10

## 2016-05-23 MED ORDER — DEXAMETHASONE SODIUM PHOSPHATE 10 MG/ML IJ SOLN
10.0000 mg | Freq: Once | INTRAMUSCULAR | Status: DC
  Filled 2016-05-23: qty 1

## 2016-05-30 ENCOUNTER — Inpatient Hospital Stay: Payer: Managed Care, Other (non HMO)

## 2016-05-30 VITALS — BP 116/68 | HR 98 | Temp 98.3°F | Resp 20

## 2016-05-30 DIAGNOSIS — D509 Iron deficiency anemia, unspecified: Secondary | ICD-10-CM | POA: Diagnosis not present

## 2016-05-30 DIAGNOSIS — D508 Other iron deficiency anemias: Secondary | ICD-10-CM

## 2016-05-30 MED ORDER — DEXAMETHASONE SODIUM PHOSPHATE 10 MG/ML IJ SOLN
10.0000 mg | Freq: Once | INTRAMUSCULAR | Status: AC
  Administered 2016-05-30: 10 mg via INTRAVENOUS
  Filled 2016-05-30: qty 1

## 2016-05-30 MED ORDER — ACETAMINOPHEN 325 MG PO TABS
650.0000 mg | ORAL_TABLET | Freq: Once | ORAL | Status: AC
  Administered 2016-05-30: 650 mg via ORAL
  Filled 2016-05-30: qty 2

## 2016-05-30 MED ORDER — DEXAMETHASONE SODIUM PHOSPHATE 100 MG/10ML IJ SOLN: 10.0000 mg | Freq: Once | INTRAMUSCULAR | Status: DC

## 2016-05-30 MED ORDER — IRON SUCROSE 20 MG/ML IV SOLN
200.0000 mg | Freq: Once | INTRAVENOUS | Status: AC
  Administered 2016-05-30: 200 mg via INTRAVENOUS
  Filled 2016-05-30: qty 10

## 2016-05-30 MED ORDER — FAMOTIDINE IN NACL 20-0.9 MG/50ML-% IV SOLN
20.0000 mg | Freq: Two times a day (BID) | INTRAVENOUS | Status: DC
  Administered 2016-05-30: 20 mg via INTRAVENOUS
  Filled 2016-05-30: qty 50

## 2016-05-30 MED ORDER — SODIUM CHLORIDE 0.9 % IV SOLN
Freq: Once | INTRAVENOUS | Status: AC
  Administered 2016-05-30: 14:00:00 via INTRAVENOUS
  Filled 2016-05-30: qty 1000

## 2016-05-30 MED ORDER — DIPHENHYDRAMINE HCL 50 MG/ML IJ SOLN
25.0000 mg | Freq: Once | INTRAMUSCULAR | Status: AC
  Administered 2016-05-30: 25 mg via INTRAVENOUS
  Filled 2016-05-30: qty 1

## 2016-06-06 ENCOUNTER — Inpatient Hospital Stay: Payer: Managed Care, Other (non HMO) | Attending: Oncology

## 2016-06-06 VITALS — BP 110/69 | HR 74 | Temp 97.0°F | Resp 20

## 2016-06-06 DIAGNOSIS — D508 Other iron deficiency anemias: Secondary | ICD-10-CM

## 2016-06-06 DIAGNOSIS — D509 Iron deficiency anemia, unspecified: Secondary | ICD-10-CM | POA: Diagnosis not present

## 2016-06-06 DIAGNOSIS — Z79899 Other long term (current) drug therapy: Secondary | ICD-10-CM | POA: Insufficient documentation

## 2016-06-06 DIAGNOSIS — B009 Herpesviral infection, unspecified: Secondary | ICD-10-CM | POA: Insufficient documentation

## 2016-06-06 MED ORDER — IRON SUCROSE 20 MG/ML IV SOLN
200.0000 mg | Freq: Once | INTRAVENOUS | Status: AC
  Administered 2016-06-06: 200 mg via INTRAVENOUS
  Filled 2016-06-06: qty 10

## 2016-06-06 MED ORDER — FAMOTIDINE IN NACL 20-0.9 MG/50ML-% IV SOLN
20.0000 mg | Freq: Two times a day (BID) | INTRAVENOUS | Status: DC
  Administered 2016-06-06: 20 mg via INTRAVENOUS
  Filled 2016-06-06: qty 50

## 2016-06-06 MED ORDER — DEXAMETHASONE SODIUM PHOSPHATE 100 MG/10ML IJ SOLN: 10.0000 mg | Freq: Once | INTRAMUSCULAR | Status: DC

## 2016-06-06 MED ORDER — ACETAMINOPHEN 325 MG PO TABS
650.0000 mg | ORAL_TABLET | Freq: Once | ORAL | Status: AC
  Administered 2016-06-06: 650 mg via ORAL
  Filled 2016-06-06: qty 2

## 2016-06-06 MED ORDER — IRON SUCROSE 20 MG/ML IV SOLN: 200.0000 mg | Freq: Once | INTRAVENOUS | Status: DC

## 2016-06-06 MED ORDER — DEXAMETHASONE SODIUM PHOSPHATE 10 MG/ML IJ SOLN
10.0000 mg | Freq: Once | INTRAMUSCULAR | Status: AC
  Administered 2016-06-06: 10 mg via INTRAVENOUS
  Filled 2016-06-06: qty 1

## 2016-06-06 MED ORDER — DIPHENHYDRAMINE HCL 50 MG/ML IJ SOLN
25.0000 mg | Freq: Once | INTRAMUSCULAR | Status: AC
  Administered 2016-06-06: 25 mg via INTRAVENOUS
  Filled 2016-06-06: qty 1

## 2016-07-10 ENCOUNTER — Other Ambulatory Visit: Payer: Self-pay

## 2016-07-11 ENCOUNTER — Ambulatory Visit: Payer: Self-pay | Admitting: Oncology

## 2016-07-11 ENCOUNTER — Other Ambulatory Visit: Payer: Self-pay

## 2016-07-11 ENCOUNTER — Ambulatory Visit: Payer: Self-pay

## 2016-07-12 ENCOUNTER — Ambulatory Visit: Payer: Self-pay | Admitting: Physician Assistant

## 2016-07-12 ENCOUNTER — Encounter: Payer: Self-pay | Admitting: Physician Assistant

## 2016-07-12 VITALS — BP 125/69 | HR 83 | Temp 98.5°F

## 2016-07-12 DIAGNOSIS — K591 Functional diarrhea: Secondary | ICD-10-CM

## 2016-07-12 NOTE — Progress Notes (Signed)
   Subjective:Diarrhea    Patient ID: Cindy Hart, female    DOB: 08/08/1995, 21 y.o.   MRN: [id number]  HPI Patient c/o loose stool for 3 days. States increasing to watery stools today. Denies spoiled food or URI symptoms. Stomach cramps with diarrhea. No blood in stool. No palliative measures for compliant.   Review of Systems Negative except compliant.    Objective:   Physical Exam No abdominal distension. Hyperactive BS. No guarding with palpation.     Assessment & Plan: Diarrhea  Lomotil as directed. Increase fluid. Follow up with PCP if conditon persist.

## 2016-07-17 ENCOUNTER — Encounter: Payer: Self-pay | Admitting: Physician Assistant

## 2016-07-17 ENCOUNTER — Other Ambulatory Visit: Payer: Self-pay

## 2016-07-17 ENCOUNTER — Ambulatory Visit: Payer: Self-pay | Admitting: Physician Assistant

## 2016-07-17 VITALS — BP 110/50 | HR 86 | Temp 98.5°F

## 2016-07-17 DIAGNOSIS — R197 Diarrhea, unspecified: Secondary | ICD-10-CM

## 2016-07-17 MED ORDER — CIPROFLOXACIN HCL 500 MG PO TABS: 500.0000 mg | ORAL_TABLET | Freq: Two times a day (BID) | ORAL | 0 refills | Status: DC

## 2016-07-17 NOTE — Progress Notes (Signed)
S: c/o continued diarrhea, states has been having diarrhea for over 10 days, was seen here and given a medicine to help and had taken immodium ad, diarrhea about 4x a day, wakes her from her sleep, no fever/chills, no recent antibiotics, no camping or travel, ?bad food as her friend threw up at the [place] restaurant right after she ate but they did have 2 different things  O: vitals wnl, nad, lungs c t a, cv rrr, abd soft nontender bs normal all 4 quads  A: diarrhea  P: brat diet, stool cullure, cipro 500mg  bid x 7d

## 2016-07-31 LAB — STOOL CULTURE: E coli, Shiga toxin Assay: NEGATIVE

## 2016-07-31 LAB — OVA AND PARASITE EXAMINATION

## 2016-07-31 NOTE — Progress Notes (Signed)
[facility]  Telephone:(336) [phone] Fax:(336) [phone]  ID: Cindy Hart OB: 28-Feb-1996  MR#: [id number]  AOZ#:[id number]  Patient Care Team: Bartholomew Boards as PCP - General (Physician Assistant)  CHIEF COMPLAINT:  Iron deficiency anemia.  INTERVAL HISTORY: Patient returns to clinic today for repeat laboratory work and further evaluation. She feels significantly improved after receiving 5 infusions of venofer. She does not complain of weakness or fatigue today. She has no neurologic complaints. She denies any recent fevers or illnesses. She denies any chest pain or hemoptysis. She has no nausea, vomiting, constipation, or diarrhea. She denies any melena or hematochezia. She has no urinary complaints. Patient offers no specific complaints today.  REVIEW OF SYSTEMS:   Review of Systems  Constitutional: Negative for fever, malaise/fatigue and weight loss.  Respiratory: Negative for cough, hemoptysis and shortness of breath.   Cardiovascular: Negative.  Negative for chest pain and leg swelling.  Gastrointestinal: Negative.  Negative for abdominal pain, blood in stool and melena.  Genitourinary: Negative.   Musculoskeletal: Negative.   Skin: Negative.  Negative for rash.  Neurological: Negative.  Negative for weakness.  Psychiatric/Behavioral: Negative.  The patient is not nervous/anxious.     As per HPI. Otherwise, a complete review of systems is negative.  PAST MEDICAL HISTORY: Past Medical History:  Diagnosis Date  . HSV-1 (herpes simplex virus 1) infection     PAST SURGICAL HISTORY: No past surgical history on file.  FAMILY HISTORY: Family History  Problem Relation Age of Onset  . Thyroid disease Mother   . Diabetes Maternal Grandmother   . Heart disease Maternal Grandmother   . Hypertension Maternal Grandfather   . Atrial fibrillation Paternal Grandmother     ADVANCED DIRECTIVES (Y/N):  N  HEALTH MAINTENANCE: Social History  Substance  Use Topics  . Smoking status: Never Smoker  . Smokeless tobacco: Never Used  . Alcohol use No     Colonoscopy:  PAP:  Bone density:  Lipid panel:  Allergies  Allergen Reactions  . Feraheme [Ferumoxytol] Anaphylaxis    Current Outpatient Prescriptions  Medication Sig Dispense Refill  . Norethin Ace-Eth Estrad-FE (TAYTULLA) 1-20 MG-MCG(24) CAPS Take 1 capsule by mouth daily. 28 capsule 4   Current Facility-Administered Medications  Medication Dose Route Frequency Provider Last Rate Last Dose  . dexamethasone (DECADRON) injection 10 mg  10 mg Intravenous Once Jeralyn Ruths, MD        OBJECTIVE: Vitals:   08/01/16 1358  BP: 118/74  Pulse: 85  Resp: 18  Temp: 99.2 F (37.3 C)     Body mass index is 21.82 kg/m.    ECOG FS:0 - Asymptomatic  General: Well-developed, well-nourished, no acute distress. Eyes: Pink conjunctiva, anicteric sclera. Lungs: Clear to auscultation bilaterally. Heart: Regular rate and rhythm. No rubs, murmurs, or gallops. Abdomen: Soft, nontender, nondistended. No organomegaly noted, normoactive bowel sounds. Musculoskeletal: No edema, cyanosis, or clubbing. Neuro: Alert, answering all questions appropriately. Cranial nerves grossly intact. Skin: No rashes or petechiae noted. Psych: Normal affect.   LAB RESULTS:  Lab Results  Component Value Date   NA 141 02/23/2016   K 4.1 02/23/2016   CL 105 02/23/2016   CO2 25 01/03/2016   GLUCOSE 80 02/23/2016   BUN 6 02/23/2016   CREATININE 0.74 02/23/2016   CALCIUM 8.9 02/23/2016   PROT 6.5 02/23/2016   ALBUMIN 4.1 02/23/2016   AST 31 02/23/2016   ALT 27 02/23/2016   ALKPHOS 68 02/23/2016   BILITOT 0.3 02/23/2016  GFRNONAA 118 02/23/2016   GFRAA 136 02/23/2016    Lab Results  Component Value Date   WBC 8.0 08/01/2016   NEUTROABS 5.5 08/01/2016   HGB 14.5 08/01/2016   HCT 42.2 08/01/2016   MCV 85.7 08/01/2016   PLT 206 08/01/2016   Lab Results  Component Value Date   IRON 102  08/01/2016   TIBC 383 08/01/2016   IRONPCTSAT 27 08/01/2016   Lab Results  Component Value Date   FERRITIN 83 08/01/2016    STUDIES: No results found.  ASSESSMENT: Iron deficiency anemia.  PLAN:    1.  Iron deficiency anemia:  Patient's hemoglobin and iron stores are now within normal limits. Previously, she had a [facility] and it was discontinued. She tolerated IV Venofer with heavy premedications without reaction. No intervention is needed at this time. Patient has been instructed to continue her oral iron supplementation. Return to clinic in 3 months with repeat laboratory work and further evaluation.  2. Lyme disease: Appears to have resolved. Continue monitoring her primary care.  Approximately 20 minutes was spent in discussion of which greater than 50% was consultation.  Patient expressed understanding and was in agreement with this plan. She also understands that She can call clinic at any time with any questions, concerns, or complaints.    Jeralyn Ruthsimothy J Adelynn Gipe, MD   08/04/2016 11:19 PM

## 2016-08-01 ENCOUNTER — Inpatient Hospital Stay: Payer: Managed Care, Other (non HMO) | Attending: Oncology

## 2016-08-01 ENCOUNTER — Encounter: Payer: Self-pay | Admitting: Obstetrics and Gynecology

## 2016-08-01 ENCOUNTER — Inpatient Hospital Stay: Payer: Managed Care, Other (non HMO)

## 2016-08-01 ENCOUNTER — Inpatient Hospital Stay (HOSPITAL_BASED_OUTPATIENT_CLINIC_OR_DEPARTMENT_OTHER): Payer: Managed Care, Other (non HMO) | Admitting: Oncology

## 2016-08-01 VITALS — BP 118/74 | HR 85 | Temp 99.2°F | Resp 18 | Wt 139.3 lb

## 2016-08-01 DIAGNOSIS — D509 Iron deficiency anemia, unspecified: Secondary | ICD-10-CM | POA: Insufficient documentation

## 2016-08-01 DIAGNOSIS — Z79899 Other long term (current) drug therapy: Secondary | ICD-10-CM

## 2016-08-01 DIAGNOSIS — B009 Herpesviral infection, unspecified: Secondary | ICD-10-CM | POA: Diagnosis not present

## 2016-08-01 DIAGNOSIS — D508 Other iron deficiency anemias: Secondary | ICD-10-CM

## 2016-08-01 LAB — CBC WITH DIFFERENTIAL/PLATELET
BASOS ABS: 0.1 10*3/uL (ref 0–0.1)
Basophils Relative: 1 %
Eosinophils Absolute: 0.2 10*3/uL (ref 0–0.7)
Eosinophils Relative: 3 %
HCT: 42.2 % (ref 35.0–47.0)
HEMOGLOBIN: 14.5 g/dL (ref 12.0–16.0)
LYMPHS PCT: 20 %
Lymphs Abs: 1.6 10*3/uL (ref 1.0–3.6)
MCH: 29.6 pg (ref 26.0–34.0)
MCHC: 34.5 g/dL (ref 32.0–36.0)
MCV: 85.7 fL (ref 80.0–100.0)
Monocytes Absolute: 0.6 10*3/uL (ref 0.2–0.9)
Monocytes Relative: 7 %
NEUTROS ABS: 5.5 10*3/uL (ref 1.4–6.5)
NEUTROS PCT: 69 %
Platelets: 206 10*3/uL (ref 150–440)
RBC: 4.92 MIL/uL (ref 3.80–5.20)
RDW: 16 % — AB (ref 11.5–14.5)
WBC: 8 10*3/uL (ref 3.6–11.0)

## 2016-08-01 LAB — FERRITIN: Ferritin: 83 ng/mL (ref 11–307)

## 2016-08-01 LAB — IRON AND TIBC
IRON: 102 ug/dL (ref 28–170)
Saturation Ratios: 27 % (ref 10.4–31.8)
TIBC: 383 ug/dL (ref 250–450)
UIBC: 281 ug/dL

## 2016-08-01 NOTE — Progress Notes (Signed)
Offers no complaints  

## 2016-08-02 ENCOUNTER — Other Ambulatory Visit: Payer: Self-pay | Admitting: *Deleted

## 2016-08-02 MED ORDER — NORETHIN ACE-ETH ESTRAD-FE 1-20 MG-MCG(24) PO CAPS: 1.0000 | ORAL_CAPSULE | Freq: Every day | ORAL | 4 refills | Status: DC

## 2016-09-16 ENCOUNTER — Encounter: Payer: Self-pay | Admitting: Physician Assistant

## 2016-09-17 ENCOUNTER — Other Ambulatory Visit: Payer: Self-pay | Admitting: Emergency Medicine

## 2016-09-17 MED ORDER — VALACYCLOVIR HCL 1 G PO TABS: 1000.0000 mg | ORAL_TABLET | Freq: Two times a day (BID) | ORAL | 12 refills | Status: DC

## 2016-09-17 NOTE — Telephone Encounter (Signed)
Pt has hx of fever blisters, is going to the beach and knows the sun will aggravate her sx, ?if could get refill on valtrex, [facility] [address]

## 2016-09-17 NOTE — Telephone Encounter (Signed)
Patient called and expressed that she has developed a few fever blister and wanted to know if we can call in Valtrex to [facility].

## 2016-09-17 NOTE — Telephone Encounter (Signed)
[facility].

## 2016-10-11 ENCOUNTER — Ambulatory Visit: Payer: Self-pay | Admitting: Physician Assistant

## 2016-10-11 ENCOUNTER — Encounter: Payer: Self-pay | Admitting: Physician Assistant

## 2016-10-11 VITALS — BP 106/60 | HR 82 | Temp 98.3°F

## 2016-10-11 DIAGNOSIS — B09 Unspecified viral infection characterized by skin and mucous membrane lesions: Secondary | ICD-10-CM

## 2016-10-11 MED ORDER — HYDROXYZINE HCL 50 MG PO TABS: 50.0000 mg | ORAL_TABLET | Freq: Three times a day (TID) | ORAL | 0 refills | Status: DC | PRN

## 2016-10-11 MED ORDER — HYDROCORTISONE VALERATE 0.2 % EX OINT: 1.0000 "application " | TOPICAL_OINTMENT | Freq: Two times a day (BID) | CUTANEOUS | 0 refills | Status: DC

## 2016-10-11 NOTE — Progress Notes (Signed)
   Subjective:Rash    Patient ID: Cindy Hart, female    DOB: 04/19/1995, 21 y.o.   MRN: [id number]  HPI Patient c/o right axillary rash for one week. Statesburning sensation. Initially suspect new deodorant but rash only on right side. History of Herpes Simplex I. States Valacyclovir gives mild relief.   Review of Systems Negative except for compliant.     Objective:   Physical Exam No acute distress. Papula/vesiclar lesion right axillary area.       Assessment & Plan:Viral exanthem  Continue Valtrex. Start [employer] and Atarax. Follow up 3 days. Will consider Dermatologist if no improvement.

## 2016-10-29 NOTE — Progress Notes (Signed)
[facility]  Telephone:([phone] Fax:(336) [phone]  ID: Cindy Hart OB: [birth year]  MR#: [id number]  QIO#:[id number]  Patient Care Team: Bartholomew Boards as PCP - General (Physician Assistant)  CHIEF COMPLAINT:  Iron deficiency anemia.  INTERVAL HISTORY: Patient returns to clinic today for repeat laboratory work and further evaluation. She feels significantly improved after receiving 5 infusions of venofer. She does not complain of weakness or fatigue today. She has no neurologic complaints. She denies any recent fevers or illnesses. She denies any chest pain or hemoptysis. She has no nausea, vomiting, constipation, or diarrhea. She denies any melena or hematochezia. She has no urinary complaints. Patient offers no specific complaints today.  REVIEW OF SYSTEMS:   Review of Systems  Constitutional: Negative for fever, malaise/fatigue and weight loss.  Respiratory: Negative for cough, hemoptysis and shortness of breath.   Cardiovascular: Negative.  Negative for chest pain and leg swelling.  Gastrointestinal: Negative.  Negative for abdominal pain, blood in stool and melena.  Genitourinary: Negative.   Musculoskeletal: Negative.   Skin: Negative.  Negative for rash.  Neurological: Negative.  Negative for weakness.  Psychiatric/Behavioral: Negative.  The patient is not nervous/anxious.     As per HPI. Otherwise, a complete review of systems is negative.  PAST MEDICAL HISTORY: Past Medical History:  Diagnosis Date  . HSV-1 (herpes simplex virus 1) infection     PAST SURGICAL HISTORY: History reviewed. No pertinent surgical history.  FAMILY HISTORY: Family History  Problem Relation Age of Onset  . Thyroid disease Mother   . Diabetes Maternal Grandmother   . Heart disease Maternal Grandmother   . Hypertension Maternal Grandfather   . Atrial fibrillation Paternal Grandmother     ADVANCED DIRECTIVES (Y/N):  N  HEALTH MAINTENANCE: Social History   Substance Use Topics  . Smoking status: Never Smoker  . Smokeless tobacco: Never Used  . Alcohol use No     Colonoscopy:  PAP:  Bone density:  Lipid panel:  Allergies  Allergen Reactions  . Feraheme [Ferumoxytol] Anaphylaxis    Current Outpatient Prescriptions  Medication Sig Dispense Refill  . Norethin Ace-Eth Estrad-FE (TAYTULLA) 1-20 MG-MCG(24) CAPS Take 1 capsule by mouth daily. 28 capsule 4  . valACYclovir (VALTREX) 1000 MG tablet Take 1 tablet (1,000 mg total) by mouth 2 (two) times daily. 20 tablet 12   Current Facility-Administered Medications  Medication Dose Route Frequency Provider Last Rate Last Dose  . dexamethasone (DECADRON) injection 10 mg  10 mg Intravenous Once Jeralyn Ruths, MD        OBJECTIVE: Vitals:   10/31/16 1407  BP: 119/74  Pulse: 80     Body mass index is 22.58 kg/m.    ECOG FS:0 - Asymptomatic  General: Well-developed, well-nourished, no acute distress. Eyes: Pink conjunctiva, anicteric sclera. Lungs: Clear to auscultation bilaterally. Heart: Regular rate and rhythm. No rubs, murmurs, or gallops. Abdomen: Soft, nontender, nondistended. No organomegaly noted, normoactive bowel sounds. Musculoskeletal: No edema, cyanosis, or clubbing. Neuro: Alert, answering all questions appropriately. Cranial nerves grossly intact. Skin: No rashes or petechiae noted. Psych: Normal affect.   LAB RESULTS:  Lab Results  Component Value Date   NA 141 02/23/2016   K 4.1 02/23/2016   CL 105 02/23/2016   CO2 25 01/03/2016   GLUCOSE 80 02/23/2016   BUN 6 02/23/2016   CREATININE 0.74 02/23/2016   CALCIUM 8.9 02/23/2016   PROT 6.5 02/23/2016   ALBUMIN 4.1 02/23/2016   AST 31 02/23/2016   ALT  27 02/23/2016   ALKPHOS 68 02/23/2016   BILITOT 0.3 02/23/2016   GFRNONAA 118 02/23/2016   GFRAA 136 02/23/2016    Lab Results  Component Value Date   WBC 7.8 10/31/2016   NEUTROABS 4.9 10/31/2016   HGB 14.0 10/31/2016   HCT 40.5 10/31/2016   MCV  87.7 10/31/2016   PLT 242 10/31/2016   Lab Results  Component Value Date   IRON 66 10/31/2016   TIBC 391 10/31/2016   IRONPCTSAT 17 10/31/2016   Lab Results  Component Value Date   FERRITIN 69 10/31/2016    STUDIES: No results found.  ASSESSMENT: Iron deficiency anemia.  PLAN:    1.  Iron deficiency anemia:  Patient's hemoglobin and iron stores continue to be within normal limits. Previously, she had a reaction to [facility] and it was discontinued. She tolerated IV Venofer with heavy premedications without reaction. She last received IV Venofer on June 06, 2016. No intervention is needed at this time. Patient has been instructed to continue her oral iron supplementation. Return to clinic in 4 months with repeat laboratory work and further evaluation.  2. Lyme disease: Appears to have resolved. Continue monitoring her primary care.  Approximately 20 minutes was spent in discussion of which greater than 50% was consultation.  Patient expressed understanding and was in agreement with this plan. She also understands that She can call clinic at any time with any questions, concerns, or complaints.    Jeralyn Ruths, MD   11/01/2016 10:36 AM

## 2016-10-31 ENCOUNTER — Inpatient Hospital Stay: Payer: PRIVATE HEALTH INSURANCE

## 2016-10-31 ENCOUNTER — Encounter: Payer: Self-pay | Admitting: Oncology

## 2016-10-31 ENCOUNTER — Inpatient Hospital Stay: Payer: PRIVATE HEALTH INSURANCE | Attending: Oncology | Admitting: Oncology

## 2016-10-31 ENCOUNTER — Other Ambulatory Visit: Payer: Self-pay

## 2016-10-31 VITALS — BP 119/74 | HR 80 | Wt 144.2 lb

## 2016-10-31 DIAGNOSIS — D509 Iron deficiency anemia, unspecified: Secondary | ICD-10-CM | POA: Diagnosis present

## 2016-10-31 DIAGNOSIS — D508 Other iron deficiency anemias: Secondary | ICD-10-CM

## 2016-10-31 LAB — CBC WITH DIFFERENTIAL/PLATELET
Basophils Absolute: 0.1 10*3/uL (ref 0–0.1)
Basophils Relative: 1 %
EOS ABS: 0.1 10*3/uL (ref 0–0.7)
Eosinophils Relative: 2 %
HCT: 40.5 % (ref 35.0–47.0)
HEMOGLOBIN: 14 g/dL (ref 12.0–16.0)
LYMPHS ABS: 2 10*3/uL (ref 1.0–3.6)
Lymphocytes Relative: 26 %
MCH: 30.3 pg (ref 26.0–34.0)
MCHC: 34.6 g/dL (ref 32.0–36.0)
MCV: 87.7 fL (ref 80.0–100.0)
Monocytes Absolute: 0.7 10*3/uL (ref 0.2–0.9)
Monocytes Relative: 9 %
NEUTROS PCT: 62 %
Neutro Abs: 4.9 10*3/uL (ref 1.4–6.5)
Platelets: 242 10*3/uL (ref 150–440)
RBC: 4.62 MIL/uL (ref 3.80–5.20)
RDW: 12.9 % (ref 11.5–14.5)
WBC: 7.8 10*3/uL (ref 3.6–11.0)

## 2016-10-31 LAB — IRON AND TIBC
IRON: 66 ug/dL (ref 28–170)
Saturation Ratios: 17 % (ref 10.4–31.8)
TIBC: 391 ug/dL (ref 250–450)
UIBC: 325 ug/dL

## 2016-10-31 LAB — FERRITIN: Ferritin: 69 ng/mL (ref 11–307)

## 2017-01-06 ENCOUNTER — Other Ambulatory Visit: Payer: Self-pay | Admitting: Obstetrics and Gynecology

## 2017-03-16 NOTE — Progress Notes (Deleted)
[facility]  [phone] Fax:(336) [phone]  ID: C[facility] OB: 10/20/1995  MR#: [id number]  OZD#:[id number]#:[id number]  Patient Care Team: Bartholomew BoardsFisher, Susan W, PA-C as PCP - General (Physician Assistant)  CHIEF COMPLAINT:  Iron deficiency anemia.  INTERVAL HISTORY: Patient returns to clinic today for repeat laboratory work and further evaluation. She feels significantly improved after receiving 5 infusions of venofer. She does not complain of weakness or fatigue today. She has no neurologic complaints. She denies any recent fevers or illnesses. She denies any chest pain or hemoptysis. She has no nausea, vomiting, constipation, or diarrhea. She denies any melena or hematochezia. She has no urinary complaints. Patient offers no specific complaints today.  REVIEW OF SYSTEMS:   Review of Systems  Constitutional: Negative for fever, malaise/fatigue and weight loss.  Respiratory: Negative for cough, hemoptysis and shortness of breath.   Cardiovascular: Negative.  Negative for chest pain and leg swelling.  Gastrointestinal: Negative.  Negative for abdominal pain, blood in stool and melena.  Genitourinary: Negative.   Musculoskeletal: Negative.   Skin: Negative.  Negative for rash.  Neurological: Negative.  Negative for weakness.  Psychiatric/Behavioral: Negative.  The patient is not nervous/anxious.     As per HPI. Otherwise, a complete review of systems is negative.  PAST MEDICAL HISTORY: Past Medical History:  Diagnosis Date  . HSV-1 (herpes simplex virus 1) infection     PAST SURGICAL HISTORY: No past surgical history on file.  FAMILY HISTORY: Family History  Problem Relation Age of Onset  . Thyroid disease Mother   . Diabetes Maternal Grandmother   . Heart disease Maternal Grandmother   . Hypertension Maternal Grandfather   . Atrial fibrillation Paternal Grandmother     ADVANCED DIRECTIVES (Y/N):  N  HEALTH MAINTENANCE: Social History   Tobacco  Use  . Smoking status: Never Smoker  . Smokeless tobacco: Never Used  Substance Use Topics  . Alcohol use: No    Alcohol/week: 0.0 oz  . Drug use: No     Colonoscopy:  PAP:  Bone density:  Lipid panel:  Allergies  Allergen Reactions  . Feraheme [Ferumoxytol] Anaphylaxis    Current Outpatient Medications  Medication Sig Dispense Refill  . TAYTULLA 1-20 MG-MCG(24) CAPS TAKE 1 CAPSULE EVERY DAY 28 capsule 4  . valACYclovir (VALTREX) 1000 MG tablet Take 1 tablet (1,000 mg total) by mouth 2 (two) times daily. 20 tablet 12   Current Facility-Administered Medications  Medication Dose Route Frequency Provider Last Rate Last Dose  . dexamethasone (DECADRON) injection 10 mg  10 mg Intravenous Once Jeralyn RuthsFinnegan, Ellwood Steidle J, MD        OBJECTIVE: There were no vitals filed for this visit.   There is no height or weight on file to calculate BMI.    ECOG FS:0 - Asymptomatic  General: Well-developed, well-nourished, no acute distress. Eyes: Pink conjunctiva, anicteric sclera. Lungs: Clear to auscultation bilaterally. Heart: Regular rate and rhythm. No rubs, murmurs, or gallops. Abdomen: Soft, nontender, nondistended. No organomegaly noted, normoactive bowel sounds. Musculoskeletal: No edema, cyanosis, or clubbing. Neuro: Alert, answering all questions appropriately. Cranial nerves grossly intact. Skin: No rashes or petechiae noted. Psych: Normal affect.   LAB RESULTS:  Lab Results  Component Value Date   NA 141 02/23/2016   K 4.1 02/23/2016   CL 105 02/23/2016   CO2 25 01/03/2016   GLUCOSE 80 02/23/2016   BUN 6 02/23/2016   CREATININE 0.74 02/23/2016   CALCIUM 8.9 02/23/2016   PROT 6.5 02/23/2016  ALBUMIN 4.1 02/23/2016   AST 31 02/23/2016   ALT 27 02/23/2016   ALKPHOS 68 02/23/2016   BILITOT 0.3 02/23/2016   GFRNONAA 118 02/23/2016   GFRAA 136 02/23/2016    Lab Results  Component Value Date   WBC 7.8 10/31/2016   NEUTROABS 4.9 10/31/2016   HGB 14.0 10/31/2016   HCT  40.5 10/31/2016   MCV 87.7 10/31/2016   PLT 242 10/31/2016   Lab Results  Component Value Date   IRON 66 10/31/2016   TIBC 391 10/31/2016   IRONPCTSAT 17 10/31/2016   Lab Results  Component Value Date   FERRITIN 69 10/31/2016    STUDIES: No results found.  ASSESSMENT: Iron deficiency anemia.  PLAN:    1.  Iron deficiency anemia:  Patient's hemoglobin and iron stores continue to be within normal limits. Previously, she had a reaction to [facility] and it was discontinued. She tolerated IV Venofer with heavy premedications without reaction. She last received IV Venofer on June 06, 2016. No intervention is needed at this time. Patient has been instructed to continue her oral iron supplementation. Return to clinic in 4 months with repeat laboratory work and further evaluation.  2. Lyme disease: Appears to have resolved. Continue monitoring her primary care.  Approximately 20 minutes was spent in discussion of which greater than 50% was consultation.  Patient expressed understanding and was in agreement with this plan. She also understands that She can call clinic at any time with any questions, concerns, or complaints.    Jeralyn Ruths, MD   03/16/2017 2:55 PM

## 2017-03-20 ENCOUNTER — Inpatient Hospital Stay: Payer: PRIVATE HEALTH INSURANCE | Admitting: Oncology

## 2017-03-20 ENCOUNTER — Inpatient Hospital Stay: Payer: PRIVATE HEALTH INSURANCE

## 2017-05-05 ENCOUNTER — Other Ambulatory Visit: Payer: Self-pay

## 2017-05-05 ENCOUNTER — Encounter: Payer: Self-pay | Admitting: *Deleted

## 2017-05-06 NOTE — Discharge Instructions (Signed)
T & A INSTRUCTION SHEET - [facility] °[facility] ° °CREIGHTON VAUGHT, MD °PAUL H. JUENGEL, MD  °P. SCOTT BENNETT °CHAPMAN MCQUEEN, MD ° °[address] [place], [place] [address] TEL. [phone] °[address] [place] [address] [phone] ° °INFORMATION SHEET FOR A TONSILLECTOMY AND ADENDOIDECTOMY ° °About Your Tonsils and Adenoids ° The tonsils and adenoids are normal body tissues that are part of our immune system.  They normally help to protect us against diseases that may enter our mouth and nose.  However, sometimes the tonsils and/or adenoids become too large and obstruct our breathing, especially at night. °  ° If either of these things happen it helps to remove the tonsils and adenoids in order to become healthier. The operation to remove the tonsils and adenoids is called a tonsillectomy and adenoidectomy. ° °The Location of Your Tonsils and Adenoids ° The tonsils are located in the back of the throat on both side and sit in a cradle of muscles. The adenoids are located in the roof of the mouth, behind the nose, and closely associated with the opening of the Eustachian tube to the ear. ° °Surgery on Tonsils and Adenoids ° A tonsillectomy and adenoidectomy is a short operation which takes about thirty minutes.  This includes being put to sleep and being awakened.  Tonsillectomies and adenoidectomies are performed at [facility] and may require observation period in the recovery room prior to going home. ° °Following the Operation for a Tonsillectomy ° A cautery machine is used to control bleeding.  Bleeding from a tonsillectomy and adenoidectomy is minimal and postoperatively the risk of bleeding is approximately four percent, although this rarely life threatening. ° ° ° °After your tonsillectomy and adenoidectomy post-op care at home: ° °1. Our patients are able to go home the same day.  You may be given prescriptions for pain  medications and antibiotics, if indicated. °2. It is extremely important to remember that fluid intake is of utmost importance after a tonsillectomy.  The amount that you drink must be maintained in the postoperative period.  A good indication of whether a child is getting enough fluid is whether his/her urine output is constant.  As long as children are urinating or wetting their diaper every 6 - 8 hours this is usually enough fluid intake.   °3. Although rare, this is a risk of some bleeding in the first ten days after surgery.  This is usually occurs between day five and nine postoperatively.  This risk of bleeding is approximately four percent.  If you or your child should have any bleeding you should remain calm and notify our office or go directly to the Emergency Room at [facility] where they will contact us. Our doctors are available seven days a week for notification.  We recommend sitting up quietly in a chair, place an ice pack on the front of the neck and spitting out the blood gently until we are able to contact you.  Adults should gargle gently with ice water and this may help stop the bleeding.  If the bleeding does not stop after a short time, i.e. 10 to 15 minutes, or seems to be increasing again, please contact us or go to the hospital.   °4. It is common for the pain to be worse at 5 - 7 days postoperatively.  This occurs because the “scab” is peeling off and the mucous membrane (skin of   the throat) is growing back where the tonsils were.   °5. It is common for a low-grade fever, less than 102, during the first week after a tonsillectomy and adenoidectomy.  It is usually due to not drinking enough liquids, and we suggest your use liquid Tylenol or the pain medicine with Tylenol prescribed in order to keep your temperature below 102.  Please follow the directions on the back of the bottle. °6. Do not take aspirin or any products that contain aspirin such as Bufferin, Anacin,  Ecotrin, aspirin gum, Goodies, BC headache powders, etc., after a T&A because it can promote bleeding.  Please check with our office before administering any other medication that may been prescribed by other doctors during the two week post-operative period. °7. If you happen to look in the mirror or into your child’s mouth you will see white/gray patches on the back of the throat.  This is what a scab looks like in the mouth and is normal after having a T&A.  It will disappear once the tonsil area heals completely. However, it may cause a noticeable odor, and this too will disappear with time.     °8. You or your child may experience ear pain after having a T&A.  This is called referred pain and comes from the throat, but it is felt in the ears.  Ear pain is quite common and expected.  It will usually go away after ten days.  There is usually nothing wrong with the ears, and it is primarily due to the healing area stimulating the nerve to the ear that runs along the side of the throat.  Use either the prescribed pain medicine or Tylenol as needed.  °9. The throat tissues after a tonsillectomy are obviously sensitive.  Smoking around children who have had a tonsillectomy significantly increases the risk of bleeding.  DO NOT SMOKE!  ° °General Anesthesia, Adult, Care After °These instructions provide you with information about caring for yourself after your procedure. Your health care provider may also give you more specific instructions. Your treatment has been planned according to current medical practices, but problems sometimes occur. Call your health care provider if you have any problems or questions after your procedure. °What can I expect after the procedure? °After the procedure, it is common to have: °· Vomiting. °· A sore throat. °· Mental slowness. ° °It is common to feel: °· Nauseous. °· Cold or shivery. °· Sleepy. °· Tired. °· Sore or achy, even in parts of your body where you did not have  surgery. ° °Follow these instructions at home: °For at least 24 hours after the procedure: °· Do not: °? Participate in activities where you could fall or become injured. °? Drive. °? Use heavy machinery. °? Drink alcohol. °? Take sleeping pills or medicines that cause drowsiness. °? Make important decisions or sign legal documents. °? Take care of children on your own. °· Rest. °Eating and drinking °· If you vomit, drink water, juice, or soup when you can drink without vomiting. °· Drink enough fluid to keep your urine clear or pale yellow. °· Make sure you have little or no nausea before eating solid foods. °· Follow the diet recommended by your health care provider. °General instructions °· Have a responsible adult stay with you until you are awake and alert. °· Return to your normal activities as told by your health care provider. Ask your health care provider what activities are safe for you. °· Take over-the-counter and   prescription medicines only as told by your health care provider. °· If you smoke, do not smoke without supervision. °· Keep all follow-up visits as told by your health care provider. This is important. °Contact a health care provider if: °· You continue to have nausea or vomiting at home, and medicines are not helpful. °· You cannot drink fluids or start eating again. °· You cannot urinate after 8-12 hours. °· You develop a skin rash. °· You have fever. °· You have increasing redness at the site of your procedure. °Get help right away if: °· You have difficulty breathing. °· You have chest pain. °· You have unexpected bleeding. °· You feel that you are having a life-threatening or urgent problem. °This information is not intended to replace advice given to you by your health care provider. Make sure you discuss any questions you have with your health care provider. °Document Released: 05/27/2000 Document Revised: 07/24/2015 Document Reviewed: 02/02/2015 °Elsevier Interactive Patient Education  © 2018 [employer] ° °

## 2017-05-08 ENCOUNTER — Ambulatory Visit
Admission: RE | Admit: 2017-05-08 | Discharge: 2017-05-08 | Disposition: A | Discharge status: Home / Self Care | Payer: PRIVATE HEALTH INSURANCE | Source: Ambulatory Visit | Attending: Unknown Physician Specialty | Admitting: Unknown Physician Specialty

## 2017-05-08 ENCOUNTER — Ambulatory Visit: Payer: PRIVATE HEALTH INSURANCE | Admitting: Anesthesiology

## 2017-05-08 ENCOUNTER — Encounter
Admission: RE | Disposition: A | Discharge status: Home / Self Care | Payer: Self-pay | Source: Ambulatory Visit | Attending: Unknown Physician Specialty

## 2017-05-08 DIAGNOSIS — J3501 Chronic tonsillitis: Secondary | ICD-10-CM | POA: Diagnosis present

## 2017-05-08 HISTORY — DX: Anemia, unspecified: D64.9

## 2017-05-08 HISTORY — DX: Motion sickness, initial encounter: T75.3XXA

## 2017-05-08 HISTORY — DX: Exercise induced bronchospasm: J45.990

## 2017-05-08 HISTORY — DX: Presence of spectacles and contact lenses: Z97.3

## 2017-05-08 HISTORY — DX: Migraine, unspecified, not intractable, without status migrainosus: G43.909

## 2017-05-08 HISTORY — PX: TONSILLECTOMY: SHX5217

## 2017-05-08 SURGERY — TONSILLECTOMY
Anesthesia: General | Site: Throat | Wound class: Clean Contaminated

## 2017-05-08 MED ORDER — MIDAZOLAM HCL 5 MG/5ML IJ SOLN
INTRAMUSCULAR | Status: DC | PRN
  Administered 2017-05-08: 2 mg via INTRAVENOUS

## 2017-05-08 MED ORDER — DEXAMETHASONE SODIUM PHOSPHATE 4 MG/ML IJ SOLN
INTRAMUSCULAR | Status: DC | PRN
  Administered 2017-05-08: 10 mg via INTRAVENOUS

## 2017-05-08 MED ORDER — FENTANYL CITRATE (PF) 100 MCG/2ML IJ SOLN
25.0000 ug | INTRAMUSCULAR | Status: DC | PRN
  Administered 2017-05-08: 25 ug via INTRAVENOUS

## 2017-05-08 MED ORDER — OXYCODONE HCL 5 MG/5ML PO SOLN
5.0000 mg | Freq: Once | ORAL | Status: AC | PRN
  Administered 2017-05-08: 5 mg via ORAL

## 2017-05-08 MED ORDER — ONDANSETRON HCL 4 MG/2ML IJ SOLN: 4.0000 mg | Freq: Once | INTRAMUSCULAR | Status: DC | PRN

## 2017-05-08 MED ORDER — PROPOFOL 10 MG/ML IV BOLUS
INTRAVENOUS | Status: DC | PRN
  Administered 2017-05-08: 50 mg via INTRAVENOUS
  Administered 2017-05-08: 150 mg via INTRAVENOUS

## 2017-05-08 MED ORDER — SCOPOLAMINE 1 MG/3DAYS TD PT72
1.0000 | MEDICATED_PATCH | Freq: Once | TRANSDERMAL | Status: DC
  Administered 2017-05-08: 1.5 mg via TRANSDERMAL

## 2017-05-08 MED ORDER — LIDOCAINE HCL (CARDIAC) 20 MG/ML IV SOLN
INTRAVENOUS | Status: DC | PRN
  Administered 2017-05-08: 50 mg via INTRAVENOUS

## 2017-05-08 MED ORDER — SUCCINYLCHOLINE CHLORIDE 20 MG/ML IJ SOLN
INTRAMUSCULAR | Status: DC | PRN
  Administered 2017-05-08: 80 mg via INTRAVENOUS

## 2017-05-08 MED ORDER — HYDROCODONE-ACETAMINOPHEN 7.5-325 MG/15ML PO SOLN: 15.0000 mL | Freq: Four times a day (QID) | ORAL | 0 refills | Status: AC | PRN

## 2017-05-08 MED ORDER — GLYCOPYRROLATE 0.2 MG/ML IJ SOLN
INTRAMUSCULAR | Status: DC | PRN
  Administered 2017-05-08: .1 mg via INTRAVENOUS

## 2017-05-08 MED ORDER — OXYCODONE HCL 5 MG PO TABS: 5.0000 mg | ORAL_TABLET | Freq: Once | ORAL | Status: AC | PRN

## 2017-05-08 MED ORDER — LACTATED RINGERS IV SOLN: INTRAVENOUS | Status: DC

## 2017-05-08 MED ORDER — ONDANSETRON HCL 4 MG/2ML IJ SOLN
INTRAMUSCULAR | Status: DC | PRN
  Administered 2017-05-08: 4 mg via INTRAVENOUS

## 2017-05-08 MED ORDER — ACETAMINOPHEN 10 MG/ML IV SOLN
1000.0000 mg | Freq: Once | INTRAVENOUS | Status: AC
  Administered 2017-05-08: 1000 mg via INTRAVENOUS

## 2017-05-08 MED ORDER — BUPIVACAINE HCL (PF) 0.5 % IJ SOLN
INTRAMUSCULAR | Status: DC | PRN
  Administered 2017-05-08: 9 mL

## 2017-05-08 MED ORDER — FENTANYL CITRATE (PF) 100 MCG/2ML IJ SOLN
INTRAMUSCULAR | Status: DC | PRN
  Administered 2017-05-08: 100 ug via INTRAVENOUS

## 2017-05-08 MED ORDER — ONDANSETRON HCL 4 MG PO TABS: 4.0000 mg | ORAL_TABLET | Freq: Three times a day (TID) | ORAL | 1 refills | Status: AC | PRN

## 2017-05-08 MED ORDER — LACTATED RINGERS IV SOLN
INTRAVENOUS | Status: DC
  Administered 2017-05-08: 09:00:00 via INTRAVENOUS

## 2017-05-08 MED ORDER — ACETAMINOPHEN 10 MG/ML IV SOLN: 1000.0000 mg | Freq: Once | INTRAVENOUS | Status: DC | PRN

## 2017-05-08 SURGICAL SUPPLY — 18 items
CANISTER SUCT 1200ML W/VALVE (MISCELLANEOUS) ×3 IMPLANT
COAG SUCT 10F 3.5MM HAND CTRL (MISCELLANEOUS) ×3 IMPLANT
DRAPE HEAD BAR (DRAPES) ×3 IMPLANT
ELECT CAUTERY BLADE TIP 2.5 (TIP) ×3
ELECT REM PT RETURN 9FT ADLT (ELECTROSURGICAL) ×3
ELECTRODE CAUTERY BLDE TIP 2.5 (TIP) ×1 IMPLANT
ELECTRODE REM PT RTRN 9FT ADLT (ELECTROSURGICAL) ×1 IMPLANT
GLOVE BIO SURGEON STRL SZ7.5 (GLOVE) ×3 IMPLANT
HANDLE SUCTION POOLE (INSTRUMENTS) ×1 IMPLANT
KIT TURNOVER KIT A (KITS) ×3 IMPLANT
NEEDLE HYPO 25GX1X1/2 BEV (NEEDLE) ×3 IMPLANT
NS IRRIG 500ML POUR BTL (IV SOLUTION) ×3 IMPLANT
PACK TONSIL/ADENOIDS (PACKS) ×3 IMPLANT
PENCIL ELECTRO HAND CTR (MISCELLANEOUS) ×3 IMPLANT
STRAP BODY AND KNEE 60X3 (MISCELLANEOUS) ×3 IMPLANT
SUCTION POOLE HANDLE (INSTRUMENTS) ×3
SYR 5ML LL (SYRINGE) ×3 IMPLANT
SYRINGE 10CC LL (SYRINGE) ×3 IMPLANT

## 2017-05-08 NOTE — Transfer of Care (Addendum)
Immediate Anesthesia Transfer of Care Note  Patient: Cindy [facility]  Procedure(s) Performed: TONSILLECTOMY (N/A Throat)  Patient Location: PACU  Anesthesia Type: General  Level of Consciousness: awake, alert  and patient cooperative  Airway and Oxygen Therapy: Patient Spontanous Breathing and Patient connected to supplemental oxygen  Post-op Assessment: Post-op Vital signs reviewed, Patient's Cardiovascular Status Stable, Respiratory Function Stable, Patent Airway and No signs of Nausea or vomiting  Post-op Vital Signs: Reviewed and stable  Complications: No apparent anesthesia complications

## 2017-05-08 NOTE — Op Note (Signed)
PREOPERATIVE DIAGNOSIS:  TONSILLITIS  POSTOPERATIVE DIAGNOSIS:  Chronic Tonsillitis  OPERATION:  Tonsillectomy.  SURGEON:  Davina Pokehapman T. Shariah Assad, MD  ANESTHESIA:  General endotracheal.  OPERATIVE FINDINGS:  Large tonsils.  DESCRIPTION OF THE PROCEDURE: Cindy Hart was identified in the holding area and taken to the operating room and placed in the supine position.  After general endotracheal anesthesia, the table was turned 45 degrees and the patient was draped in the usual fashion for a tonsillectomy.  A mouth gag was inserted into the oral cavity.  There were large tonsils.  Beginning on the left-hand side a tenaculum was used to grasp the tonsil and the Bovie cautery was used to dissect it free from the fossa.  In a similar fashion, the right tonsil was removed.  Meticulous hemostasis was achieved using the Bovie cautery.  With both tonsils removed and no active bleeding, 0.5% plain Marcaine was used to inject the anterior and posterior tonsillar pillars bilaterally.  A total of 9ml was used.  The patient tolerated the procedure well and was awakened in the operating room and taken to the recovery room in stable condition.   CULTURES:  None.  SPECIMENS:  Tonsils.  ESTIMATED BLOOD LOSS:  Less than 10 ml.  Davina PokeChapman T Jaskarn Schweer  05/08/2017  11:03 AM

## 2017-05-08 NOTE — Anesthesia Preprocedure Evaluation (Signed)
Anesthesia Evaluation  Patient identified by MRN, date of birth, ID band Patient awake    Reviewed: Allergy & Precautions, NPO status , Patient's Chart, lab work & pertinent test results  History of Anesthesia Complications Negative for: history of anesthetic complications  Airway Mallampati: I  TM Distance: >3 FB Neck ROM: Full    Dental no notable dental hx.    Pulmonary asthma (exercise-induced) ,    Pulmonary exam normal breath sounds clear to auscultation       Cardiovascular Exercise Tolerance: Good negative cardio ROS Normal cardiovascular exam Rhythm:Regular Rate:Normal     Neuro/Psych negative neurological ROS     GI/Hepatic negative GI ROS,   Endo/Other  negative endocrine ROS  Renal/GU negative Renal ROS     Musculoskeletal   Abdominal   Peds  Hematology  (+) Blood dyscrasia, anemia ,   Anesthesia Other Findings   Reproductive/Obstetrics                             Anesthesia Physical Anesthesia Plan  ASA: II  Anesthesia Plan: General   Post-op Pain Management:    Induction: Intravenous  PONV Risk Score and Plan: 2 and Dexamethasone, Ondansetron and Scopolamine patch - Pre-op  Airway Management Planned: Oral ETT  Additional Equipment:   Intra-op Plan:   Post-operative Plan: Extubation in OR  Informed Consent: I have reviewed the patients History and Physical, chart, labs and discussed the procedure including the risks, benefits and alternatives for the proposed anesthesia with the patient or authorized representative who has indicated his/her understanding and acceptance.     Plan Discussed with: CRNA  Anesthesia Plan Comments:         Anesthesia Quick Evaluation

## 2017-05-08 NOTE — Anesthesia Procedure Notes (Signed)
Procedure Name: Intubation Date/Time: 05/08/2017 10:50 AM Performed by: Jimmy PicketAmyot, Tyronza Happe, CRNA Pre-anesthesia Checklist: Patient identified, Emergency Drugs available, Suction available, Patient being monitored and Timeout performed Patient Re-evaluated:Patient Re-evaluated prior to induction Oxygen Delivery Method: Circle system utilized Preoxygenation: Pre-oxygenation with 100% oxygen Induction Type: IV induction Ventilation: Mask ventilation without difficulty Laryngoscope Size: Miller and 2 Grade View: Grade I Tube type: Oral Rae Tube size: 7.0 mm Number of attempts: 1 Placement Confirmation: ETT inserted through vocal cords under direct vision,  positive ETCO2 and breath sounds checked- equal and bilateral Tube secured with: Tape Dental Injury: Teeth and Oropharynx as per pre-operative assessment

## 2017-05-08 NOTE — H&P (Signed)
The patient's history has been reviewed, patient examined, no change in status, stable for surgery.  Questions were answered to the patients satisfaction.  

## 2017-05-08 NOTE — Anesthesia Postprocedure Evaluation (Signed)
Anesthesia Post Note  Patient: Cindy Hart  Procedure(s) Performed: TONSILLECTOMY (N/A Throat)  Patient location during evaluation: PACU Anesthesia Type: General Level of consciousness: awake and alert, oriented and patient cooperative Pain management: pain level controlled Vital Signs Assessment: post-procedure vital signs reviewed and stable Respiratory status: spontaneous breathing, nonlabored ventilation and respiratory function stable Cardiovascular status: blood pressure returned to baseline and stable Postop Assessment: adequate PO intake Anesthetic complications: no    Reed BreechAndrea Kahlen Morais

## 2017-05-09 ENCOUNTER — Encounter: Payer: Self-pay | Admitting: Unknown Physician Specialty

## 2017-05-12 ENCOUNTER — Encounter: Payer: Self-pay | Admitting: Unknown Physician Specialty

## 2017-05-13 LAB — SURGICAL PATHOLOGY

## 2017-05-19 ENCOUNTER — Other Ambulatory Visit: Payer: Self-pay | Admitting: Obstetrics and Gynecology

## 2017-05-20 ENCOUNTER — Encounter: Payer: Self-pay | Admitting: Obstetrics and Gynecology

## 2017-05-20 ENCOUNTER — Ambulatory Visit (INDEPENDENT_AMBULATORY_CARE_PROVIDER_SITE_OTHER): Payer: PRIVATE HEALTH INSURANCE | Admitting: Obstetrics and Gynecology

## 2017-05-20 VITALS — BP 104/70 | HR 93 | Ht 66.0 in | Wt 139.8 lb

## 2017-05-20 DIAGNOSIS — Z01419 Encounter for gynecological examination (general) (routine) without abnormal findings: Secondary | ICD-10-CM

## 2017-05-20 MED ORDER — NORETHIN ACE-ETH ESTRAD-FE 1-20 MG-MCG(24) PO CAPS: 1.0000 | ORAL_CAPSULE | Freq: Every day | ORAL | 12 refills | Status: DC

## 2017-05-20 NOTE — Progress Notes (Signed)
  Subjective:     Cindy Hart is a single white  22 y.o. female and is here for a comprehensive physical exam. 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  Social History   Socioeconomic History  . Marital status: Single    Spouse name: Not on file  . Number of children: Not on file  . Years of education: Not on file  . Highest education level: Not on file  Social Needs  . Financial resource strain: Not on file  . Food insecurity - worry: Not on file  . Food insecurity - inability: Not on file  . Transportation needs - medical: Not on file  . Transportation needs - non-medical: Not on file  Occupational History  . Not on file  Tobacco Use  . Smoking status: Never Smoker  . Smokeless tobacco: Never Used  Substance and Sexual Activity  . Alcohol use: No    Alcohol/week: 0.0 oz  . Drug use: No  . Sexual activity: Yes    Birth control/protection: Pill  Other Topics Concern  . Not on file  Social History Narrative  . Not on file   Health Maintenance  Topic Date Due  . HIV Screening  12/04/2011  . TETANUS/TDAP  12/04/2015  . INFLUENZA VACCINE  10/02/2016  . CHLAMYDIA SCREENING  04/11/2017    The following portions of the patient's history were reviewed and updated as appropriate: allergies, current medications, past family history, past medical history, past social history, past surgical history and problem list.  Review of Systems Pertinent items are noted in HPI.   Objective:    General appearance: alert, cooperative and appears stated age Neck: no adenopathy, no carotid bruit, no JVD, supple, symmetrical, trachea midline and thyroid not enlarged, symmetric, no tenderness/mass/nodules Lungs: clear to auscultation bilaterally Breasts: normal appearance, no masses or tenderness Heart: regular rate and rhythm, S1, S2 normal, no murmur, click, rub or gallop Abdomen: soft, non-tender; bowel sounds normal; no masses,  no organomegaly Pelvic:  cervix normal in appearance, external genitalia normal, no adnexal masses or tenderness, no cervical motion tenderness, rectovaginal septum normal, uterus normal size, shape, and consistency, vagina normal without discharge and wetprep requested- normal findings.    Assessment:    Healthy female exam. OCP user     Plan:  Reassured of normal findings. Will continue on current OCPs RTC 1 year or as needed.  Melody Shambley,CNM   See After Visit Summary for Counseling Recommendations

## 2017-05-21 NOTE — Addendum Note (Signed)
Addended by: Rosine BeatLONTZ, Keng Jewel L on: 05/21/2017 08:40 AM   Modules accepted: Orders

## 2017-05-23 ENCOUNTER — Encounter: Payer: Self-pay | Admitting: Obstetrics and Gynecology

## 2017-05-23 ENCOUNTER — Other Ambulatory Visit: Payer: Self-pay | Admitting: *Deleted

## 2017-05-23 MED ORDER — FLUCONAZOLE 150 MG PO TABS: 150.0000 mg | ORAL_TABLET | Freq: Once | ORAL | 1 refills | Status: AC

## 2017-05-24 LAB — GC/CHLAMYDIA PROBE AMP
CHLAMYDIA, DNA PROBE: NEGATIVE
NEISSERIA GONORRHOEAE BY PCR: NEGATIVE

## 2017-06-10 ENCOUNTER — Encounter: Payer: Self-pay | Admitting: Obstetrics and Gynecology

## 2017-06-11 ENCOUNTER — Other Ambulatory Visit: Payer: Self-pay | Admitting: *Deleted

## 2017-06-11 ENCOUNTER — Encounter: Payer: Self-pay | Admitting: Obstetrics and Gynecology

## 2017-06-11 MED ORDER — NORGESTIM-ETH ESTRAD TRIPHASIC 0.18/0.215/0.25 MG-25 MCG PO TABS: 1.0000 | ORAL_TABLET | Freq: Every day | ORAL | 4 refills | Status: DC

## 2018-03-06 ENCOUNTER — Other Ambulatory Visit: Payer: Self-pay | Admitting: Obstetrics and Gynecology

## 2018-04-29 ENCOUNTER — Other Ambulatory Visit: Payer: Self-pay | Admitting: *Deleted

## 2018-04-29 ENCOUNTER — Telehealth: Payer: Self-pay | Admitting: Obstetrics and Gynecology

## 2018-04-29 MED ORDER — NORGESTIM-ETH ESTRAD TRIPHASIC 0.18/0.215/0.25 MG-25 MCG PO TABS: 1.0000 | ORAL_TABLET | Freq: Every day | ORAL | 1 refills | Status: DC

## 2018-04-29 NOTE — Telephone Encounter (Signed)
Done-ac 

## 2018-04-29 NOTE — Telephone Encounter (Signed)
The patient called and stated that she needs one more refill for birth control due to her having to reschedule her annual exam further out due to Melody being out of the office 05/29/2018. Please advise.

## 2018-05-22 ENCOUNTER — Encounter: Payer: PRIVATE HEALTH INSURANCE | Admitting: Obstetrics and Gynecology

## 2018-05-26 ENCOUNTER — Encounter: Payer: PRIVATE HEALTH INSURANCE | Admitting: Obstetrics and Gynecology

## 2018-05-29 ENCOUNTER — Encounter: Payer: PRIVATE HEALTH INSURANCE | Admitting: Obstetrics and Gynecology

## 2018-06-17 ENCOUNTER — Encounter: Payer: Self-pay | Admitting: *Deleted

## 2018-06-19 ENCOUNTER — Encounter: Payer: PRIVATE HEALTH INSURANCE | Admitting: Obstetrics and Gynecology

## 2018-06-24 ENCOUNTER — Encounter: Payer: PRIVATE HEALTH INSURANCE | Admitting: Obstetrics and Gynecology

## 2018-08-09 IMAGING — DX DG FOOT COMPLETE 3+V*L*
3 series · 3 of 3 positions shown · non-contrast
Comparison: 12/25/2015

CLINICAL DATA: Small red spot between the first and second digits 3
weeks ago. Pain, swelling, redness.

EXAM:
LEFT FOOT - COMPLETE 3+ VIEW

[foot ap]
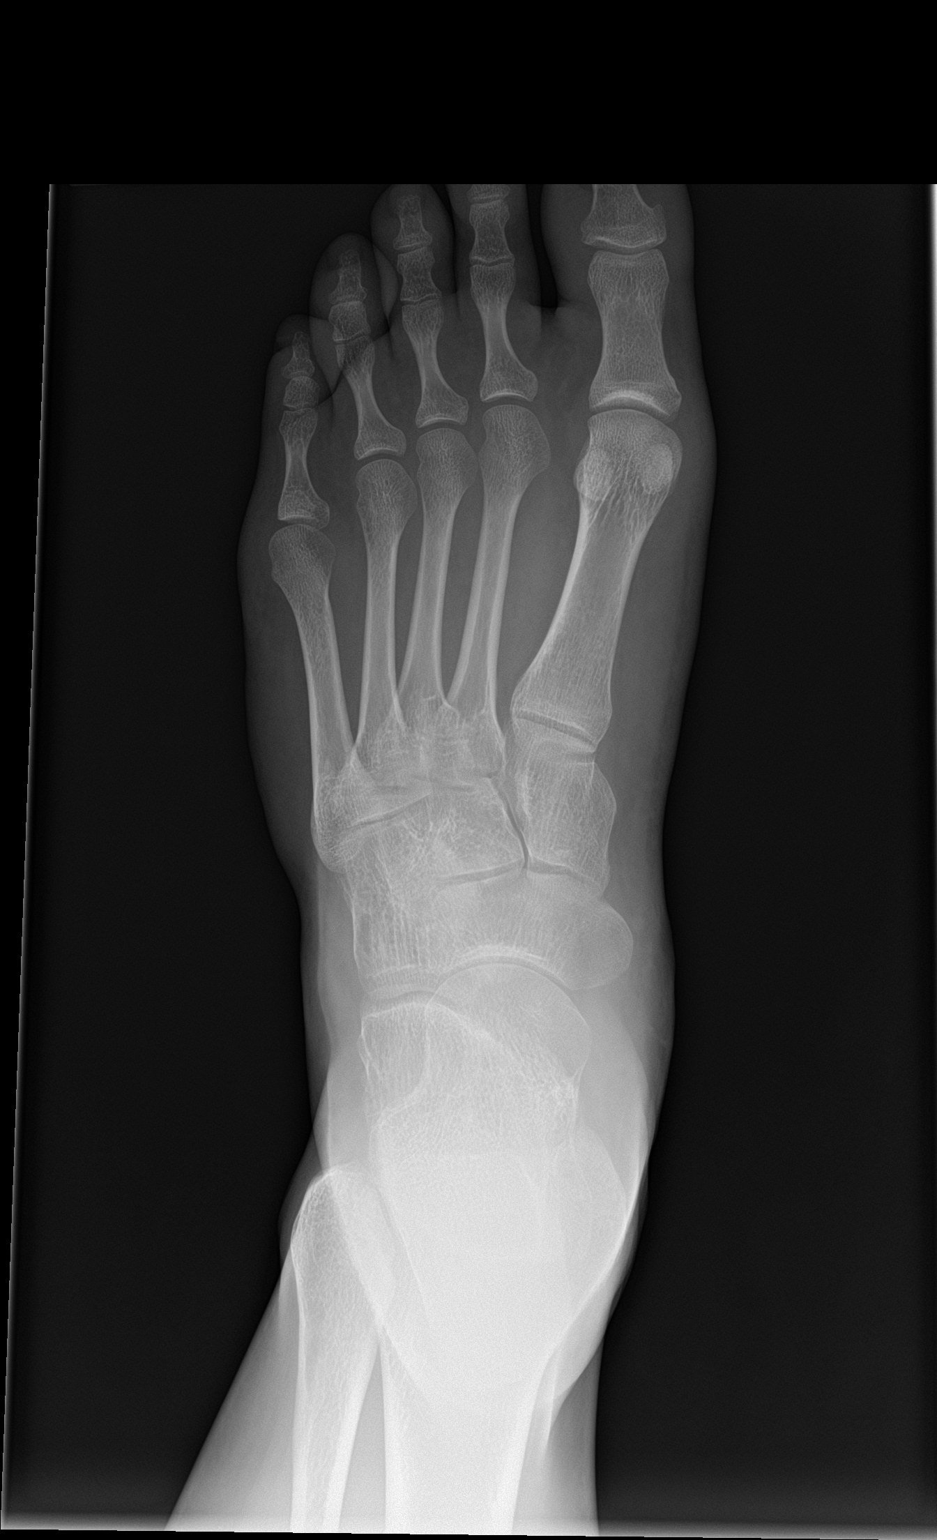

[foot obl]
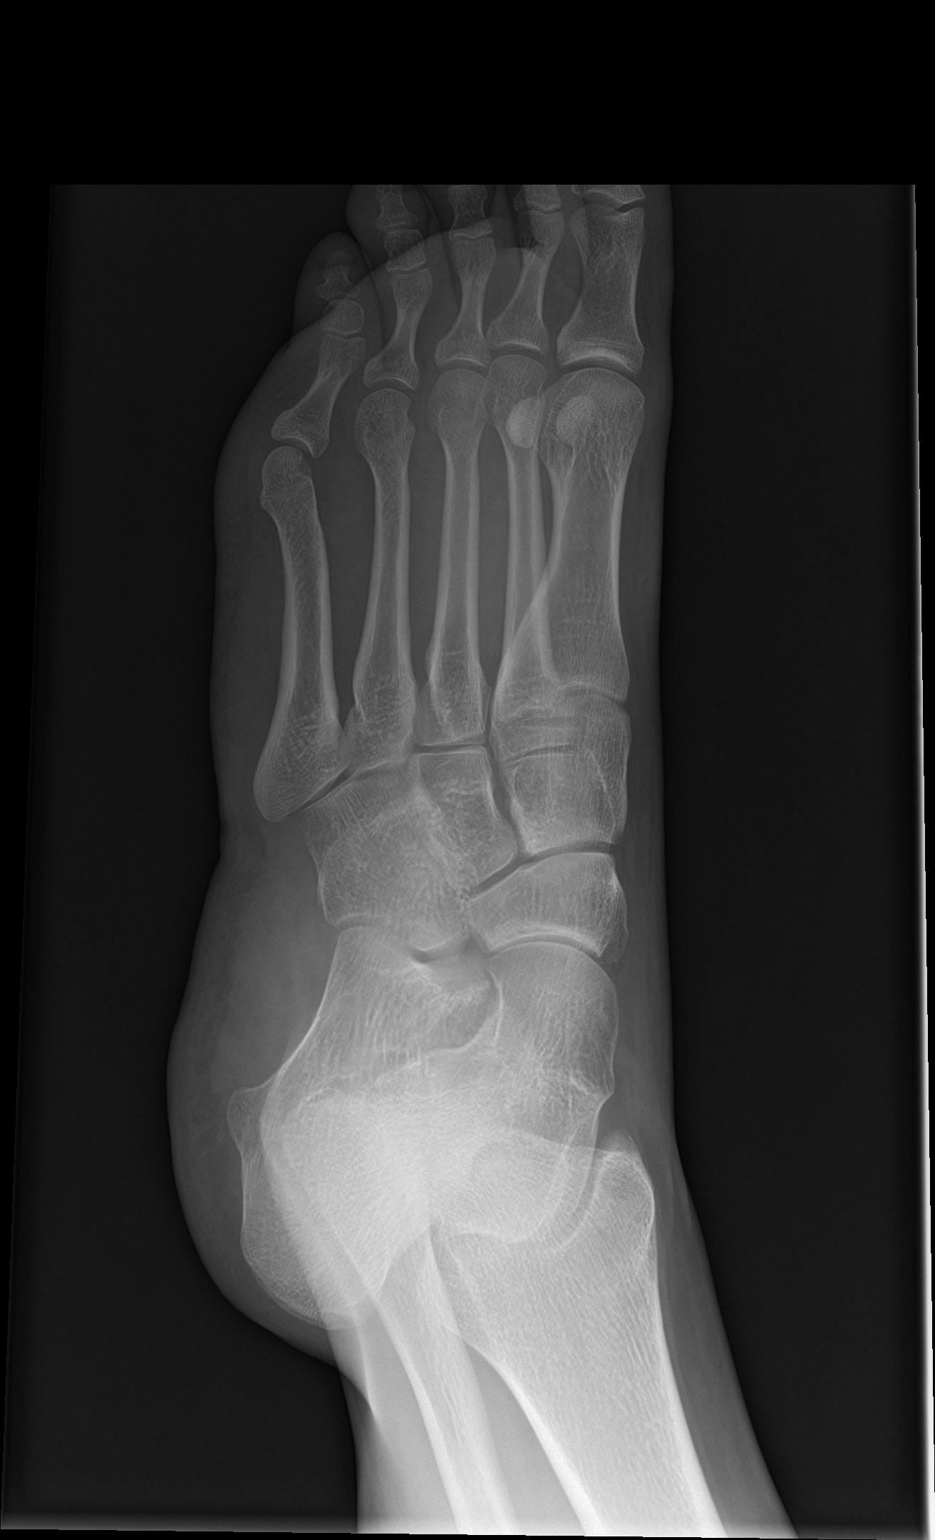

[foot lat]
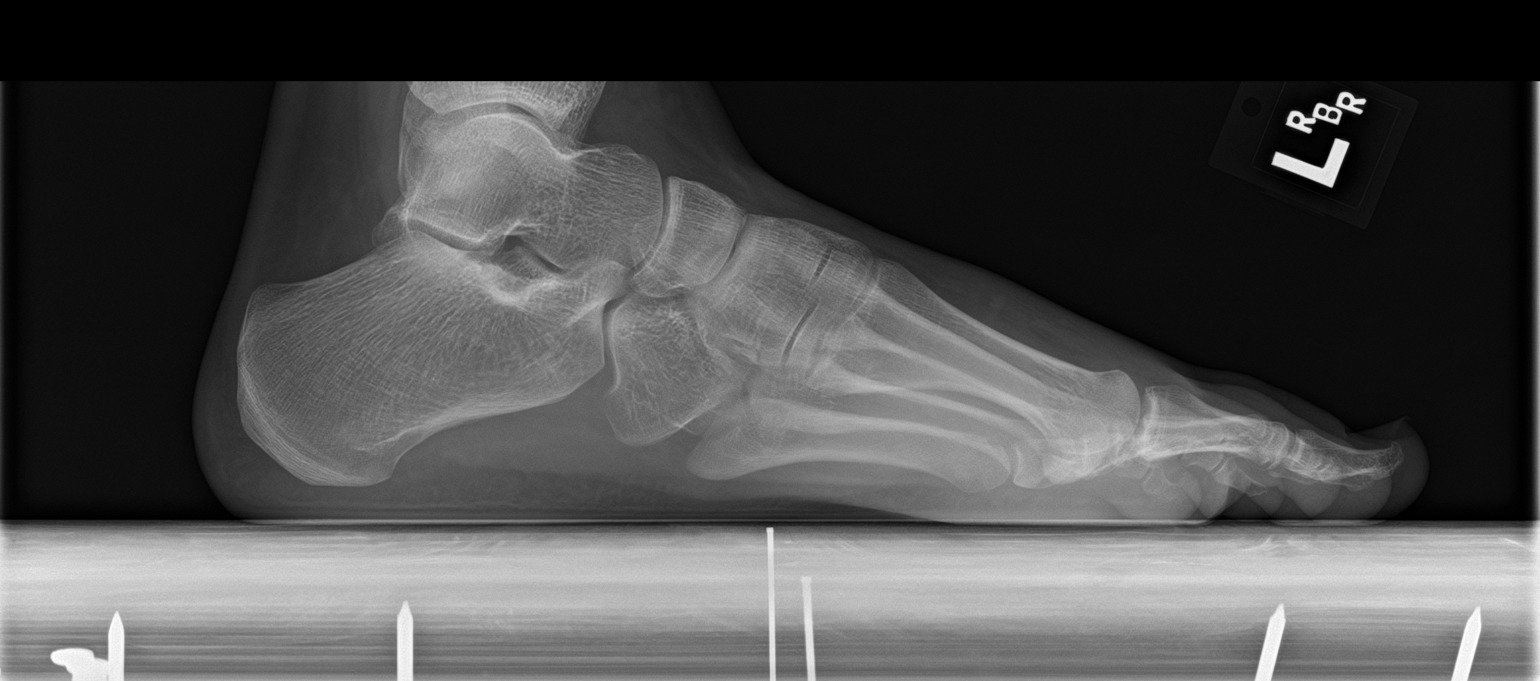

[3 of 3 positions shown; findings below may reference images not displayed]

FINDINGS: There is no evidence of fracture or dislocation. There is no
periosteal reaction or bone destruction. There is no evidence of
arthropathy or other focal bone abnormality. There is no soft tissue
swelling. There is no radiopaque foreign body.
IMPRESSION: No acute osseous injury of the left foot.

## 2018-08-18 ENCOUNTER — Other Ambulatory Visit: Payer: Self-pay

## 2018-08-18 ENCOUNTER — Encounter: Payer: Self-pay | Admitting: Obstetrics and Gynecology

## 2018-08-18 ENCOUNTER — Ambulatory Visit (INDEPENDENT_AMBULATORY_CARE_PROVIDER_SITE_OTHER): Payer: BC Managed Care – PPO | Admitting: Obstetrics and Gynecology

## 2018-08-18 ENCOUNTER — Other Ambulatory Visit (HOSPITAL_COMMUNITY)
Admission: RE | Admit: 2018-08-18 | Discharge: 2018-08-18 | Disposition: A | Discharge status: Home / Self Care | Payer: BC Managed Care – PPO | Source: Ambulatory Visit | Attending: Obstetrics and Gynecology | Admitting: Obstetrics and Gynecology

## 2018-08-18 VITALS — BP 103/71 | HR 72 | Ht 67.0 in | Wt 141.9 lb

## 2018-08-18 DIAGNOSIS — Z01419 Encounter for gynecological examination (general) (routine) without abnormal findings: Secondary | ICD-10-CM

## 2018-08-18 MED ORDER — NORGESTIM-ETH ESTRAD TRIPHASIC 0.18/0.215/0.25 MG-35 MCG PO TABS: 1.0000 | ORAL_TABLET | Freq: Every day | ORAL | 4 refills | Status: DC

## 2018-08-18 NOTE — Progress Notes (Signed)
Subjective:     Cindy Hart is a single white  23 y.o. female and is here for a comprehensive physical exam. The patient reports no change since last years annual exam. Pt sexually active with singular female partner, [facility] No concerns for STIs at this time. Reports feeling safe in relationship. 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 Pt denies any changes in family hx, no surgeries. Denies illicit drug use, consumes occasional alcohol. Pt reports healthy food intake and just recently began beach body diet program. Denies current exercise regimen. Wears seatbelt. She is happy with current OCPs.   Social History   Socioeconomic History  . Marital status: Single    Spouse name: Not on file  . Number of children: Not on file  . Years of education: Not on file  . Highest education level: Not on file  Occupational History  . Not on file  Social Needs  . Financial resource strain: Not on file  . Food insecurity    Worry: Not on file    Inability: Not on file  . Transportation needs    Medical: Not on file    Non-medical: Not on file  Tobacco Use  . Smoking status: Never Smoker  . Smokeless tobacco: Never Used  Substance and Sexual Activity  . Alcohol use: No    Alcohol/week: 0.0 standard drinks  . Drug use: No  . Sexual activity: Yes    Birth control/protection: Pill  Lifestyle  . Physical activity    Days per week: Not on file    Minutes per session: Not on file  . Stress: Not on file  Relationships  . Social Musicianconnections    Talks on phone: Not on file    Gets together: Not on file    Attends religious service: Not on file    Active member of club or organization: Not on file    Attends meetings of clubs or organizations: Not on file    Relationship status: Not on file  . Intimate partner violence    Fear of current or ex partner: Not on file    Emotionally abused: Not on file    Physically abused:  Not on file    Forced sexual activity: Not on file  Other Topics Concern  . Not on file  Social History Narrative  . Not on file   Health Maintenance  Topic Date Due  . HIV Screening  12/04/2011  . TETANUS/TDAP  12/04/2015  . CHLAMYDIA SCREENING  04/11/2017  . PAP-Cervical Cytology Screening  12/03/2017  . PAP SMEAR-Modifier  12/03/2017  . INFLUENZA VACCINE  10/03/2018    The following portions of the patient's history were reviewed and updated as appropriate: allergies, current medications, past family history, past medical history, past social history, past surgical history and problem list.  Review of Systems Pertinent items noted in HPI and remainder of comprehensive ROS otherwise negative.   Objective:    BP 103/71   Pulse 72   Ht 5\' 7"  (1.702 m)   Wt 141 lb 14.4 oz (64.4 kg)   LMP 08/06/2018   BMI 22.22 kg/m  General appearance: alert, cooperative and appears stated age Neck: no adenopathy, no carotid bruit, no JVD, supple, symmetrical, trachea midline and thyroid not enlarged, symmetric, no tenderness/mass/nodules Lungs: clear to auscultation bilaterally Breasts: normal appearance, no masses or tenderness Heart: regular rate and rhythm, S1, S2 normal, no  murmur, click, rub or gallop Abdomen: soft, non-tender; bowel sounds normal; no masses,  no organomegaly Pelvic: cervix normal in appearance, external genitalia normal, no adnexal masses or tenderness, no cervical motion tenderness, rectovaginal septum normal, uterus normal size, shape, and consistency and vagina normal without discharge   breast exam not done, please ignore templet. Assessment:    Healthy female exam. OCP user     Plan:  Refill OCP for 1 yr RTC in 49yr for AE or as needed     Cindy Hart, SNM Rifka Ramey,CNM   See After Visit Summary for Counseling Recommendations

## 2018-08-18 NOTE — Patient Instructions (Signed)
 Preventive Care 18-39 Years, Female Preventive care refers to lifestyle choices and visits with your health care provider that can promote health and wellness. What does preventive care include?   A yearly physical exam. This is also called an annual well check.  Dental exams once or twice a year.  Routine eye exams. Ask your health care provider how often you should have your eyes checked.  Personal lifestyle choices, including: ? Daily care of your teeth and gums. ? Regular physical activity. ? Eating a healthy diet. ? Avoiding tobacco and drug use. ? Limiting alcohol use. ? Practicing safe sex. ? Taking vitamin and mineral supplements as recommended by your health care provider. What happens during an annual well check? The services and screenings done by your health care provider during your annual well check will depend on your age, overall health, lifestyle risk factors, and family history of disease. Counseling Your health care provider may ask you questions about your:  Alcohol use.  Tobacco use.  Drug use.  Emotional well-being.  Home and relationship well-being.  Sexual activity.  Eating habits.  Work and work environment.  Method of birth control.  Menstrual cycle.  Pregnancy history. Screening You may have the following tests or measurements:  Height, weight, and BMI.  Diabetes screening. This is done by checking your blood sugar (glucose) after you have not eaten for a while (fasting).  Blood pressure.  Lipid and cholesterol levels. These may be checked every 5 years starting at age 20.  Skin check.  Hepatitis C blood test.  Hepatitis B blood test.  Sexually transmitted disease (STD) testing.  BRCA-related cancer screening. This may be done if you have a family history of breast, ovarian, tubal, or peritoneal cancers.  Pelvic exam and Pap test. This may be done every 3 years starting at age 21. Starting at age 30, this may be done  every 5 years if you have a Pap test in combination with an HPV test. Discuss your test results, treatment options, and if necessary, the need for more tests with your health care provider. Vaccines Your health care provider may recommend certain vaccines, such as:  Influenza vaccine. This is recommended every year.  Tetanus, diphtheria, and acellular pertussis (Tdap, Td) vaccine. You may need a Td booster every 10 years.  Varicella vaccine. You may need this if you have not been vaccinated.  HPV vaccine. If you are 26 or younger, you may need three doses over 6 months.  Measles, mumps, and rubella (MMR) vaccine. You may need at least one dose of MMR. You may also need a second dose.  Pneumococcal 13-valent conjugate (PCV13) vaccine. You may need this if you have certain conditions and were not previously vaccinated.  Pneumococcal polysaccharide (PPSV23) vaccine. You may need one or two doses if you smoke cigarettes or if you have certain conditions.  Meningococcal vaccine. One dose is recommended if you are age 19-21 years and a first-year college student living in a residence hall, or if you have one of several medical conditions. You may also need additional booster doses.  Hepatitis A vaccine. You may need this if you have certain conditions or if you travel or work in places where you may be exposed to hepatitis A.  Hepatitis B vaccine. You may need this if you have certain conditions or if you travel or work in places where you may be exposed to hepatitis B.  Haemophilus influenzae type b (Hib) vaccine. You may need this if   you have certain risk factors. Talk to your health care provider about which screenings and vaccines you need and how often you need them. This information is not intended to replace advice given to you by your health care provider. Make sure you discuss any questions you have with your health care provider. Document Released: 04/16/2001 Document Revised:  10/01/2016 Document Reviewed: 12/20/2014 Elsevier Interactive Patient Education  2019 Elsevier Inc.  

## 2018-08-20 LAB — CYTOLOGY - PAP
Chlamydia: NEGATIVE
Diagnosis: NEGATIVE
Neisseria Gonorrhea: NEGATIVE

## 2018-11-10 ENCOUNTER — Other Ambulatory Visit: Payer: Self-pay

## 2018-11-10 ENCOUNTER — Encounter: Payer: Self-pay | Admitting: Family Medicine

## 2018-11-10 ENCOUNTER — Ambulatory Visit (INDEPENDENT_AMBULATORY_CARE_PROVIDER_SITE_OTHER): Payer: BC Managed Care – PPO | Admitting: Family Medicine

## 2018-11-10 ENCOUNTER — Other Ambulatory Visit
Admission: RE | Admit: 2018-11-10 | Discharge: 2018-11-10 | Disposition: A | Discharge status: Home / Self Care | Payer: BC Managed Care – PPO | Source: Ambulatory Visit | Attending: Family Medicine | Admitting: Family Medicine

## 2018-11-10 DIAGNOSIS — R21 Rash and other nonspecific skin eruption: Secondary | ICD-10-CM | POA: Diagnosis not present

## 2018-11-10 DIAGNOSIS — L309 Dermatitis, unspecified: Secondary | ICD-10-CM

## 2018-11-10 DIAGNOSIS — Z13220 Encounter for screening for lipoid disorders: Secondary | ICD-10-CM | POA: Diagnosis not present

## 2018-11-10 DIAGNOSIS — L299 Pruritus, unspecified: Secondary | ICD-10-CM | POA: Diagnosis not present

## 2018-11-10 LAB — CBC WITH DIFFERENTIAL/PLATELET
Abs Immature Granulocytes: 0.02 10*3/uL (ref 0.00–0.07)
Basophils Absolute: 0.1 10*3/uL (ref 0.0–0.1)
Basophils Relative: 1 %
Eosinophils Absolute: 0.3 10*3/uL (ref 0.0–0.5)
Eosinophils Relative: 4 %
HCT: 40.6 % (ref 36.0–46.0)
Hemoglobin: 13.6 g/dL (ref 12.0–15.0)
Immature Granulocytes: 0 %
Lymphocytes Relative: 25 %
Lymphs Abs: 2.2 10*3/uL (ref 0.7–4.0)
MCH: 29.7 pg (ref 26.0–34.0)
MCHC: 33.5 g/dL (ref 30.0–36.0)
MCV: 88.6 fL (ref 80.0–100.0)
Monocytes Absolute: 0.8 10*3/uL (ref 0.1–1.0)
Monocytes Relative: 9 %
Neutro Abs: 5.3 10*3/uL (ref 1.7–7.7)
Neutrophils Relative %: 61 %
Platelets: 247 10*3/uL (ref 150–400)
RBC: 4.58 MIL/uL (ref 3.87–5.11)
RDW: 12.6 % (ref 11.5–15.5)
WBC: 8.7 10*3/uL (ref 4.0–10.5)
nRBC: 0 % (ref 0.0–0.2)

## 2018-11-10 LAB — COMPREHENSIVE METABOLIC PANEL
ALT: 26 U/L (ref 0–44)
AST: 21 U/L (ref 15–41)
Albumin: 3.5 g/dL (ref 3.5–5.0)
Alkaline Phosphatase: 61 U/L (ref 38–126)
Anion gap: 8 (ref 5–15)
BUN: 8 mg/dL (ref 6–20)
CO2: 26 mmol/L (ref 22–32)
Calcium: 9 mg/dL (ref 8.9–10.3)
Chloride: 103 mmol/L (ref 98–111)
Creatinine, Ser: 0.67 mg/dL (ref 0.44–1.00)
GFR calc Af Amer: >60 mL/min (ref >60)
GFR calc non Af Amer: >60 mL/min (ref >60)
Glucose, Bld: 82 mg/dL (ref 70–99)
Potassium: 4 mmol/L (ref 3.5–5.1)
Sodium: 137 mmol/L (ref 135–145)
Total Bilirubin: 0.3 mg/dL (ref 0.3–1.2)
Total Protein: 6.4 g/dL — ABNORMAL LOW (ref 6.5–8.1)

## 2018-11-10 LAB — FOLATE: Folate: 14.2 ng/mL (ref >5.9)

## 2018-11-10 LAB — LIPID PANEL
Cholesterol: 170 mg/dL (ref 0–200)
HDL: 43 mg/dL (ref >40)
LDL Cholesterol: 99 mg/dL (ref 0–99)
Total CHOL/HDL Ratio: 4 RATIO
Triglycerides: 138 mg/dL (ref <150)
VLDL: 28 mg/dL (ref 0–40)

## 2018-11-10 LAB — IRON AND TIBC
Iron: 54 ug/dL (ref 28–170)
Saturation Ratios: 12 % (ref 10.4–31.8)
TIBC: 459 ug/dL — ABNORMAL HIGH (ref 250–450)
UIBC: 405 ug/dL

## 2018-11-10 LAB — TSH: TSH: 3.156 u[IU]/mL (ref 0.350–4.500)

## 2018-11-10 MED ORDER — METHYLPREDNISOLONE 4 MG PO TBPK: ORAL_TABLET | ORAL | 0 refills | Status: DC

## 2018-11-10 MED ORDER — FAMOTIDINE 20 MG PO TABS: 20.0000 mg | ORAL_TABLET | Freq: Every day | ORAL | 0 refills | Status: DC

## 2018-11-10 MED ORDER — FEXOFENADINE HCL 180 MG PO TABS: ORAL_TABLET | ORAL | 2 refills | Status: AC

## 2018-11-10 NOTE — Progress Notes (Signed)
Virtual Visit via video Note  This visit type was conducted due to national recommendations for restrictions regarding the COVID-19 pandemic (e.g. social distancing).  This format is felt to be most appropriate for this patient at this time.  All issues noted in this document were discussed and addressed.  No physical exam was performed (except for noted visual exam findings with Video Visits).   I connected with [employer] today at  9:40 AM EDT by a video enabled telemedicine application and verified that I am speaking with the correct person using two identifiers. Location patient: home Location provider: work or home office Persons participating in the virtual visit: patient, provider  I discussed the limitations, risks, security and privacy concerns of performing an evaluation and management service by telephone and the availability of in person appointments. I also discussed with the patient that there may be a patient responsible charge related to this service. The patient expressed understanding and agreed to proceed.    HPI: Presents to clinic to establish care with PCP  Sees GYN for women's health exams, last saw in 08/2018  Past medical, social, surgical and family hx reviewed and updated accordingly in chart.   Main concern today is a widespread body rash.  Patient has been dealing with this rash off and on for the past week or so, seems to be worse at night in regards to itching.  Has small little red raised bumps all over her torso both forearms and even on legs.  Some days the rash is worse than others.  She and her mom both suspect she possibly is allergic to something in her environment like dust.  Patient also does change deodorants to different soaps frequently, so it is possible a soap has irritated her skin.  Currently she takes an over-the-counter allergy medicine, believes it is an off brand of Zyrtec.  Does have allergy testing set up for later this month to  hopefully better be able to determine what she is allergic to and combat this rash.  ROS:  Constitutional: Negative for chills, fatigue and fever.  HENT: Negative for congestion, ear pain, sinus pain and sore throat.   Eyes: Negative.   Respiratory: Negative for cough, shortness of breath and wheezing.   Cardiovascular: Negative for chest pain, palpitations and leg swelling.  Gastrointestinal: Negative for abdominal pain, diarrhea, nausea and vomiting.  Genitourinary: Negative for dysuria, frequency and urgency.  Musculoskeletal: Negative for arthralgias and myalgias.  Skin: Widespread rash/itching - ?allergy to dust Neurological: Negative for syncope, light-headedness and headaches.  Psychiatric/Behavioral: The patient is not nervous/anxious.     Past Medical History:  Diagnosis Date  . Anemia 2018   infusions - improved  . Exercise-induced asthma    none recently  . HSV-1 (herpes simplex virus 1) infection   . Migraine headache    3-4x/wk  . Motion sickness back seat cars  . Wears contact lenses     Past Surgical History:  Procedure Laterality Date  . TONSILLECTOMY N/A 05/08/2017   Procedure: TONSILLECTOMY;  Surgeon: Beverly Gust, MD;  Location: [facility];  Service: ENT;  Laterality: N/A;  . WISDOM TOOTH EXTRACTION      Family History  Problem Relation Age of Onset  . Thyroid disease Mother   . Diabetes Maternal Grandmother   . Heart disease Maternal Grandmother   . Hypertension Maternal Grandfather   . Atrial fibrillation Paternal Grandmother    Social History   Tobacco Use  . Smoking status: Never  Smoker  . Smokeless tobacco: Never Used  Substance Use Topics  . Alcohol use: No    Alcohol/week: 0.0 standard drinks    Current Outpatient Medications:  .  Norgestimate-Ethinyl Estradiol Triphasic (ORTHO TRI-CYCLEN, 28,) 0.18/0.215/0.25 MG-35 MCG tablet, Take 1 tablet by mouth daily., Disp: 3 Package, Rfl: 4 .  valACYclovir (VALTREX) 1000 MG tablet,  TAKE 1 TABLET BY MOUTH TWICE DAILY FOR 10 DAYS AS DIRECTED, Disp: 30 tablet, Rfl: 12  EXAM:  GENERAL: alert, oriented, appears well and in no acute distress  HEENT: atraumatic, conjunttiva clear, no obvious abnormalities on inspection of external nose and ears  NECK: normal movements of the head and neck  LUNGS: on inspection no signs of respiratory distress, breathing rate appears normal, no obvious gross SOB, gasping or wheezing  CV: no obvious cyanosis  MS: moves all visible extremities without noticeable abnormality  Skin: Patient is able to show me a patch of rash on her arm, looks to be small faintly-red raised maculopapular rash.  PSYCH/NEURO: pleasant and cooperative, no obvious depression or anxiety, speech and thought processing grossly intact  ASSESSMENT AND PLAN:  Discussed the following assessment and plan:  Dermatitis/nonspecific rash/itching - we will get blood work to further investigate and we will also include lipid screening and her lab panel as she is getting her blood drawn today.  Unclear reason for rash, could potentially be allergy related.  Patient will take high-dose Allegra for 2 weeks, then reduce dose to 180 mg of Allegra daily.  She will also take Pepcid at bedtime for 2 weeks to help create histamine blockade.  She will do a low-dose oral steroid taper to help calm down rash and inflammation.  Advised to avoid any soaps, lotions, deodorants or detergents that are heavily scented or have a lot of dyes added to them as these are extremely irritating the skin as things with scented often have a lot of additives and alcohols in the formula.  Recommended she use products like free and clear detergents, soaps from trusted brands like Cetaphil/CeraVe or Dove as these tend to be nicer to the skin.  She will keep appointment with allergy specialist as planned.  Labs to be done at hospital lab, orders in.    I discussed the assessment and treatment plan with the  patient. The patient was provided an opportunity to ask questions and all were answered. The patient agreed with the plan and demonstrated an understanding of the instructions.   The patient was advised to call back or seek an in-person evaluation if the symptoms worsen or if the condition fails to improve as anticipated.   Tracey HarriesLauren M Atthew Coutant, FNP

## 2018-11-11 LAB — VITAMIN B12: Vitamin B-12: 260 pg/mL (ref 180–914)

## 2018-11-11 LAB — VITAMIN D 25 HYDROXY (VIT D DEFICIENCY, FRACTURES): Vit D, 25-Hydroxy: 38.8 ng/mL (ref 30.0–100.0)

## 2018-12-14 ENCOUNTER — Other Ambulatory Visit: Payer: Self-pay

## 2018-12-14 DIAGNOSIS — Z20822 Contact with and (suspected) exposure to covid-19: Secondary | ICD-10-CM

## 2018-12-15 LAB — NOVEL CORONAVIRUS, NAA: SARS-CoV-2, NAA: DETECTED — AB

## 2018-12-18 ENCOUNTER — Ambulatory Visit: Payer: BC Managed Care – PPO | Admitting: Family Medicine

## 2019-01-13 ENCOUNTER — Encounter: Payer: Self-pay | Admitting: Family Medicine

## 2019-07-06 ENCOUNTER — Other Ambulatory Visit: Payer: Self-pay

## 2019-07-06 ENCOUNTER — Other Ambulatory Visit: Payer: Self-pay | Admitting: Certified Nurse Midwife

## 2019-07-06 MED ORDER — VALACYCLOVIR HCL 1 G PO TABS: ORAL_TABLET | ORAL | 1 refills | Status: DC

## 2019-07-06 NOTE — Telephone Encounter (Signed)
Valtrex refill sent per faxed request

## 2019-07-16 ENCOUNTER — Telehealth: Payer: Self-pay | Admitting: Certified Nurse Midwife

## 2019-07-16 NOTE — Telephone Encounter (Signed)
[facility]

## 2019-07-16 NOTE — Telephone Encounter (Signed)
Patient called requesting a refill for her birth control. She was a patient of Melody and is scheduled to see you in June. Her pharmacy of preference is [facility].

## 2019-07-19 MED ORDER — NORGESTIM-ETH ESTRAD TRIPHASIC 0.18/0.215/0.25 MG-35 MCG PO TABS: 1.0000 | ORAL_TABLET | Freq: Every day | ORAL | 1 refills | Status: DC

## 2019-07-19 NOTE — Telephone Encounter (Signed)
Prescription sent to pharmacy and LM for patient that it had been sent in.

## 2019-07-19 NOTE — Addendum Note (Signed)
Addended by: Dorian Pod on: 07/19/2019 10:01 AM   Modules accepted: Orders

## 2019-07-19 NOTE — Telephone Encounter (Signed)
Please send refill x 1. Thanks, JML

## 2019-07-30 ENCOUNTER — Other Ambulatory Visit: Payer: Self-pay | Admitting: Allergy and Immunology

## 2019-08-19 ENCOUNTER — Encounter: Payer: Self-pay | Admitting: Certified Nurse Midwife

## 2019-08-19 ENCOUNTER — Ambulatory Visit (INDEPENDENT_AMBULATORY_CARE_PROVIDER_SITE_OTHER): Payer: BC Managed Care – PPO | Admitting: Certified Nurse Midwife

## 2019-08-19 ENCOUNTER — Other Ambulatory Visit: Payer: Self-pay

## 2019-08-19 VITALS — BP 106/65 | HR 71 | Ht 67.0 in | Wt 144.3 lb

## 2019-08-19 DIAGNOSIS — Z3009 Encounter for other general counseling and advice on contraception: Secondary | ICD-10-CM | POA: Diagnosis not present

## 2019-08-19 DIAGNOSIS — Z01419 Encounter for gynecological examination (general) (routine) without abnormal findings: Secondary | ICD-10-CM

## 2019-08-19 DIAGNOSIS — Z3041 Encounter for surveillance of contraceptive pills: Secondary | ICD-10-CM

## 2019-08-19 MED ORDER — NORGESTIM-ETH ESTRAD TRIPHASIC 0.18/0.215/0.25 MG-35 MCG PO TABS: 1.0000 | ORAL_TABLET | Freq: Every day | ORAL | 4 refills | Status: DC

## 2019-08-19 NOTE — Patient Instructions (Addendum)
Ethinyl Estradiol; Norgestimate tablets What is this medicine? ETHINYL ESTRADIOL; NORGESTIMATE (ETH in il es tra DYE ole; nor JES ti mate) is an oral contraceptive. The products combine two types of female hormones, an estrogen and a progestin. They are used to prevent ovulation and pregnancy. Some products are also used to treat acne in females. This medicine may be used for other purposes; ask your health care provider or pharmacist if you have questions. COMMON BRAND NAME(S): Estarylla, Mili, MONO-LINYAH, MonoNessa, Norgestimate/Ethinyl Estradiol, Ortho Tri-Cyclen, Ortho Tri-Cyclen Lo, Ortho-Cyclen, Previfem, Sprintec, Tri-Estarylla, TRI-LINYAH, Tri-Lo-Estarylla, Tri-Lo-Marzia, Tri-Lo-Mili, Tri-Lo-Sprintec, Tri-Mili, Tri-Previfem, Tri-Sprintec, Tri-VyLibra, Trinessa, Trinessa Lo, VyLibra What should I tell my health care provider before I take this medicine? They need to know if you have or ever had any of these conditions:  abnormal vaginal bleeding  blood vessel disease or blood clots  breast, cervical, endometrial, ovarian, liver, or uterine cancer  diabetes  gallbladder disease  heart disease or recent heart attack  high blood pressure  high cholesterol  kidney disease  liver disease  migraine headaches  stroke  systemic lupus erythematosus (SLE)  tobacco smoker  an unusual or allergic reaction to estrogens, progestins, other medicines, foods, dyes, or preservatives  pregnant or trying to get pregnant  breast-feeding How should I use this medicine? Take this medicine by mouth. To reduce nausea, this medicine may be taken with food. Follow the directions on the prescription label. Take this medicine at the same time each day and in the order directed on the package. Do not take your medicine more often than directed. Contact your pediatrician regarding the use of this medicine in children. Special care may be needed. This medicine has been used in female children who  have started having menstrual periods. A patient package insert for the product will be given with each prescription and refill. Read this sheet carefully each time. The sheet may change frequently. Overdosage: If you think you have taken too much of this medicine contact a poison control center or emergency room at once. NOTE: This medicine is only for you. Do not share this medicine with others. What if I miss a dose? If you miss a dose, refer to the patient information sheet you received with your medicine for direction. If you miss more than one pill, this medicine may not be as effective and you may need to use another form of birth control. What may interact with this medicine? Do not take this medicine with the following medication:  dasabuvir; ombitasvir; paritaprevir; ritonavir  ombitasvir; paritaprevir; ritonavir This medicine may also interact with the following medications:  acetaminophen  antibiotics or medicines for infections, especially rifampin, rifabutin, rifapentine, and griseofulvin, and possibly penicillins or tetracyclines  aprepitant  ascorbic acid (vitamin C)  atorvastatin  barbiturate medicines, such as phenobarbital  bosentan  carbamazepine  caffeine  clofibrate  cyclosporine  dantrolene  doxercalciferol  felbamate  grapefruit juice  hydrocortisone  medicines for anxiety or sleeping problems, such as diazepam or temazepam  medicines for diabetes, including pioglitazone  mineral oil  modafinil  mycophenolate  nefazodone  oxcarbazepine  phenytoin  prednisolone  ritonavir or other medicines for HIV infection or AIDS  rosuvastatin  selegiline  soy isoflavones supplements  St. John's wort  tamoxifen or raloxifene  theophylline  thyroid hormones  topiramate  warfarin This list may not describe all possible interactions. Give your health care provider a list of all the medicines, herbs, non-prescription drugs, or  dietary supplements you use. Also tell them if  you smoke, drink alcohol, or use illegal drugs. Some items may interact with your medicine. What should I watch for while using this medicine? Visit your doctor or health care professional for regular checks on your progress. You will need a regular breast and pelvic exam and Pap smear while on this medicine. You should also discuss the need for regular mammograms with your health care professional, and follow his or her guidelines for these tests. This medicine can make your body retain fluid, making your fingers, hands, or ankles swell. Your blood pressure can go up. Contact your doctor or health care professional if you feel you are retaining fluid. Use an additional method of contraception during the first cycle that you take these tablets. If you have any reason to think you are pregnant, stop taking this medicine right away and contact your doctor or health care professional. If you are taking this medicine for hormone related problems, it may take several cycles of use to see improvement in your condition. Do not use this product if you smoke and are over 35 years of age. Smoking increases the risk of getting a blood clot or having a stroke while you are taking birth control pills, especially if you are more than 23 years old. If you are a smoker who is 35 years of age or younger, you are strongly advised not to smoke while taking birth control pills. This medicine can make you more sensitive to the sun. Keep out of the sun. If you cannot avoid being in the sun, wear protective clothing and use sunscreen. Do not use sun lamps or tanning beds/booths. If you wear contact lenses and notice visual changes, or if the lenses begin to feel uncomfortable, consult your eye care specialist. In some women, tenderness, swelling, or minor bleeding of the gums may occur. Notify your dentist if this happens. Brushing and flossing your teeth regularly may help limit  this. See your dentist regularly and inform your dentist of the medicines you are taking. If you are going to have elective surgery, you may need to stop taking this medicine before the surgery. Consult your health care professional for advice. This medicine does not protect you against HIV infection (AIDS) or any other sexually transmitted diseases. What side effects may I notice from receiving this medicine? Side effects that you should report to your doctor or health care professional as soon as possible:  breast tissue changes or discharge  changes in vaginal bleeding during your period or between your periods  chest pain  coughing up blood  dizziness or fainting spells  headaches or migraines  leg, arm or groin pain  severe or sudden headaches  stomach pain (severe)  sudden shortness of breath  sudden loss of coordination, especially on one side of the body  speech problems  symptoms of vaginal infection like itching, irritation or unusual discharge  tenderness in the upper abdomen  vomiting  weakness or numbness in the arms or legs, especially on one side of the body  yellowing of the eyes or skin Side effects that usually do not require medical attention (report to your doctor or health care professional if they continue or are bothersome):  breakthrough bleeding and spotting that continues beyond the 3 initial cycles of pills  breast tenderness  mood changes, anxiety, depression, frustration, anger, or emotional outbursts  increased sensitivity to sun or ultraviolet light  nausea  skin rash, acne, or brown spots on the skin  weight gain (slight) This   list may not describe all possible side effects. Call your doctor for medical advice about side effects. You may report side effects to FDA at 1-800-FDA-1088. Where should I keep my medicine? Keep out of the reach of children. Store at room temperature between 15 and 30 degrees C (59 and 86 degrees F).  Throw away any unused medicine after the expiration date. NOTE: This sheet is a summary. It may not cover all possible information. If you have questions about this medicine, talk to your doctor, pharmacist, or health care provider.  2020 Elsevier/Gold Standard (2015-10-30 08:09:09)   Preventive Care 21-39 Years Old, Female Preventive care refers to visits with your health care provider and lifestyle choices that can promote health and wellness. This includes:  A yearly physical exam. This may also be called an annual well check.  Regular dental visits and eye exams.  Immunizations.  Screening for certain conditions.  Healthy lifestyle choices, such as eating a healthy diet, getting regular exercise, not using drugs or products that contain nicotine and tobacco, and limiting alcohol use. What can I expect for my preventive care visit? Physical exam Your health care provider will check your:  Height and weight. This may be used to calculate body mass index (BMI), which tells if you are at a healthy weight.  Heart rate and blood pressure.  Skin for abnormal spots. Counseling Your health care provider may ask you questions about your:  Alcohol, tobacco, and drug use.  Emotional well-being.  Home and relationship well-being.  Sexual activity.  Eating habits.  Work and work environment.  Method of birth control.  Menstrual cycle.  Pregnancy history. What immunizations do I need?  Influenza (flu) vaccine  This is recommended every year. Tetanus, diphtheria, and pertussis (Tdap) vaccine  You may need a Td booster every 10 years. Varicella (chickenpox) vaccine  You may need this if you have not been vaccinated. Human papillomavirus (HPV) vaccine  If recommended by your health care provider, you may need three doses over 6 months. Measles, mumps, and rubella (MMR) vaccine  You may need at least one dose of MMR. You may also need a second dose. Meningococcal  conjugate (MenACWY) vaccine  One dose is recommended if you are age 19-21 years and a first-year college student living in a residence hall, or if you have one of several medical conditions. You may also need additional booster doses. Pneumococcal conjugate (PCV13) vaccine  You may need this if you have certain conditions and were not previously vaccinated. Pneumococcal polysaccharide (PPSV23) vaccine  You may need one or two doses if you smoke cigarettes or if you have certain conditions. Hepatitis A vaccine  You may need this if you have certain conditions or if you travel or work in places where you may be exposed to hepatitis A. Hepatitis B vaccine  You may need this if you have certain conditions or if you travel or work in places where you may be exposed to hepatitis B. Haemophilus influenzae type b (Hib) vaccine  You may need this if you have certain conditions. You may receive vaccines as individual doses or as more than one vaccine together in one shot (combination vaccines). Talk with your health care provider about the risks and benefits of combination vaccines. What tests do I need?  Blood tests  Lipid and cholesterol levels. These may be checked every 5 years starting at age 20.  Hepatitis C test.  Hepatitis B test. Screening  Diabetes screening. This is done by   checking your blood sugar (glucose) after you have not eaten for a while (fasting).  Sexually transmitted disease (STD) testing.  BRCA-related cancer screening. This may be done if you have a family history of breast, ovarian, tubal, or peritoneal cancers.  Pelvic exam and Pap test. This may be done every 3 years starting at age 21. Starting at age 61, this may be done every 5 years if you have a Pap test in combination with an HPV test. Talk with your health care provider about your test results, treatment options, and if necessary, the need for more tests. Follow these instructions at home: Eating and  drinking   Eat a diet that includes fresh fruits and vegetables, whole grains, lean protein, and low-fat dairy.  Take vitamin and mineral supplements as recommended by your health care provider.  Do not drink alcohol if: ? Your health care provider tells you not to drink. ? You are pregnant, may be pregnant, or are planning to become pregnant.  If you drink alcohol: ? Limit how much you have to 0-1 drink a day. ? Be aware of how much alcohol is in your drink. In the [place] one drink equals one 12 oz bottle of beer (355 mL), one 5 oz glass of wine (148 mL), or one 1 oz glass of hard liquor (44 mL). Lifestyle  Take daily care of your teeth and gums.  Stay active. Exercise for at least 30 minutes on 5 or more days each week.  Do not use any products that contain nicotine or tobacco, such as cigarettes, e-cigarettes, and chewing tobacco. If you need help quitting, ask your health care provider.  If you are sexually active, practice safe sex. Use a condom or other form of birth control (contraception) in order to prevent pregnancy and STIs (sexually transmitted infections). If you plan to become pregnant, see your health care provider for a preconception visit. What's next?  Visit your health care provider once a year for a well check visit.  Ask your health care provider how often you should have your eyes and teeth checked.  Stay up to date on all vaccines. This information is not intended to replace advice given to you by your health care provider. Make sure you discuss any questions you have with your health care provider. Document Revised: 10/30/2017 Document Reviewed: 10/30/2017 Elsevier Patient Education  [place].   Intrauterine Device Insertion An intrauterine device (IUD) is a medical device that gets inserted into the uterus to prevent pregnancy. It is a small, T-shaped device that has one or two nylon strings hanging down from it. The strings hang out of the lower  part of the uterus (cervix) to allow for future IUD removal. There are two types of IUDs available:  Copper IUD. This type of IUD has copper wire wrapped around it. Copper makes the uterus and fallopian tubes produce a fluid that kills sperm. A copper IUD may last up to 10 years.  Hormone IUD. This type of IUD is made of plastic and contains the hormone progestin (synthetic progesterone). The hormone thickens mucus in the cervix and prevents sperm from entering the uterus. It also thins the uterine lining to prevent implantation of a fertilized egg. The hormone can weaken or kill the sperm that get into the uterus. A hormone IUD may last 3-5 years. Tell a health care provider about:  Any allergies you have.  All medicines you are taking, including vitamins, herbs, eye drops, creams, and over-the-counter medicines.  Any problems you or family members have had with anesthetic medicines.  Any blood disorders you have.  Any surgeries you have had.  Any medical conditions you have, including any STIs (sexually transmitted infections) you may have.  Whether you are pregnant or may be pregnant. What are the risks? Generally, this is a safe procedure. However, problems may occur, including:  Infection.  Bleeding.  Allergic reactions to medicines.  Accidental puncture (perforation) of the uterus, or damage to other structures or organs.  Accidental placement of the IUD either in the muscle layer of the uterus (myometrium) or outside the uterus.  The IUD falling out of the uterus (expulsion). This is more common among women who have recently had a child.  Pregnancy that happens in the fallopian tube (ectopic pregnancy).  Infection of the uterus and fallopian tubes (pelvic inflammatory disease). What happens before the procedure?  Schedule the IUD insertion for when you will have your menstrual period or right after, to make sure you are not pregnant. Placement of the IUD is better  tolerated shortly after a menstrual cycle.  Follow instructions from your health care provider about eating or drinking restrictions.  Ask your health care provider about changing or stopping your regular medicines. This is especially important if you are taking diabetes medicines or blood thinners.  You may get a pain reliever to take before the procedure.  You may have tests for: ? Pregnancy. A pregnancy test involves having a urine sample taken. ? STIs. Placing an IUD in someone who has an STI can make the infection worse. ? Cervical cancer. You may have a Pap test to check for this type of cancer. This means collecting cells from your cervix to be examined under a microscope.  You may have a physical exam to determine the size and position of your uterus. The procedure may vary among health care providers and hospitals. What happens during the procedure?  A tool (speculum) will be placed in your vagina and widened so that your health care provider can see your cervix.  Medicine may be applied to your cervix to help lower your risk of infection (antiseptic medicine).  You may be given an anesthetic medicine to numb each side of your cervix (intracervical block or paracervical block). This medicine is usually given by an injection into the cervix.  A tool (uterine sound) will be inserted into your uterus to determine the length of your uterus and the direction that your uterus may be tilted.  A slim instrument or tube (IUD inserter) that holds the IUD will be inserted into your vagina, through your cervical canal, and into your uterus.  The IUD will be placed in the uterus, and the IUD inserter will be removed.  The strings that are attached to the IUD will be trimmed so that they lie just below the cervix. The procedure may vary among health care providers and hospitals. What happens after the procedure?  You may have bleeding after the procedure. This is normal. It varies from  light bleeding (spotting) for a few days to menstrual-like bleeding.  You may have cramping and pain.  You may feel dizzy or light-headed.  You may have lower back pain. Summary  An intrauterine device (IUD) is a small, T-shaped device that has one or two nylon strings hanging down from it.  Two types of IUDs are available. You may have a copper IUD or a hormone IUD.  Schedule the IUD insertion for when you will have  your menstrual period or right after, to make sure you are not pregnant. Placement of the IUD is better tolerated shortly after a menstrual cycle.  You may have bleeding after the procedure. This is normal. It varies from light spotting for a few days to menstrual-like bleeding. This information is not intended to replace advice given to you by your health care provider. Make sure you discuss any questions you have with your health care provider. Document Revised: 01/31/2017 Document Reviewed: 01/10/2016 Elsevier Patient Education  [place].   Levonorgestrel intrauterine device (IUD) What is this medicine? LEVONORGESTREL IUD (LEE voe nor jes trel) is a contraceptive (birth control) device. The device is placed inside the uterus by a healthcare professional. It is used to prevent pregnancy. This device can also be used to treat heavy bleeding that occurs during your period. This medicine may be used for other purposes; ask your health care provider or pharmacist if you have questions. COMMON BRAND NAME(S): Minette [place] What should I tell my health care provider before I take this medicine? They need to know if you have any of these conditions:  abnormal Pap smear  cancer of the breast, uterus, or cervix  diabetes  endometritis  genital or pelvic infection now or in the past  have more than one sexual partner or your partner has more than one partner  heart disease  history of an ectopic or tubal pregnancy  immune system  problems  IUD in place  liver disease or tumor  problems with blood clots or take blood-thinners  seizures  use intravenous drugs  uterus of unusual shape  vaginal bleeding that has not been explained  an unusual or allergic reaction to levonorgestrel, other hormones, silicone, or polyethylene, medicines, foods, dyes, or preservatives  pregnant or trying to get pregnant  breast-feeding How should I use this medicine? This device is placed inside the uterus by a health care professional. Talk to your pediatrician regarding the use of this medicine in children. Special care may be needed. Overdosage: If you think you have taken too much of this medicine contact a poison control center or emergency room at once. NOTE: This medicine is only for you. Do not share this medicine with others. What if I miss a dose? This does not apply. Depending on the brand of device you have inserted, the device will need to be replaced every 3 to 6 years if you wish to continue using this type of birth control. What may interact with this medicine? Do not take this medicine with any of the following medications:  amprenavir  bosentan  fosamprenavir This medicine may also interact with the following medications:  aprepitant  armodafinil  barbiturate medicines for inducing sleep or treating seizures  bexarotene  boceprevir  griseofulvin  medicines to treat seizures like carbamazepine, ethotoin, felbamate, oxcarbazepine, phenytoin, topiramate  modafinil  pioglitazone  rifabutin  rifampin  rifapentine  some medicines to treat HIV infection like atazanavir, efavirenz, indinavir, lopinavir, nelfinavir, tipranavir, ritonavir  St. John's wort  warfarin This list may not describe all possible interactions. Give your health care provider a list of all the medicines, herbs, non-prescription drugs, or dietary supplements you use. Also tell them if you smoke, drink alcohol, or use  illegal drugs. Some items may interact with your medicine. What should I watch for while using this medicine? Visit your doctor or health care professional for regular check ups. See your doctor if you or your partner has sexual contact  with others, becomes HIV positive, or gets a sexual transmitted disease. This product does not protect you against HIV infection (AIDS) or other sexually transmitted diseases. You can check the placement of the IUD yourself by reaching up to the top of your vagina with clean fingers to feel the threads. Do not pull on the threads. It is a good habit to check placement after each menstrual period. Call your doctor right away if you feel more of the IUD than just the threads or if you cannot feel the threads at all. The IUD may come out by itself. You may become pregnant if the device comes out. If you notice that the IUD has come out use a backup birth control method like condoms and call your health care provider. Using tampons will not change the position of the IUD and are okay to use during your period. This IUD can be safely scanned with magnetic resonance imaging (MRI) only under specific conditions. Before you have an MRI, tell your healthcare provider that you have an IUD in place, and which type of IUD you have in place. What side effects may I notice from receiving this medicine? Side effects that you should report to your doctor or health care professional as soon as possible:  allergic reactions like skin rash, itching or hives, swelling of the face, lips, or tongue  fever, flu-like symptoms  genital sores  high blood pressure  no menstrual period for 6 weeks during use  pain, swelling, warmth in the leg  pelvic pain or tenderness  severe or sudden headache  signs of pregnancy  stomach cramping  sudden shortness of breath  trouble with balance, talking, or walking  unusual vaginal bleeding, discharge  yellowing of the eyes or skin Side  effects that usually do not require medical attention (report to your doctor or health care professional if they continue or are bothersome):  acne  breast pain  change in sex drive or performance  changes in weight  cramping, dizziness, or faintness while the device is being inserted  headache  irregular menstrual bleeding within first 3 to 6 months of use  nausea This list may not describe all possible side effects. Call your doctor for medical advice about side effects. You may report side effects to FDA at 1-800-FDA-1088. Where should I keep my medicine? This does not apply. NOTE: This sheet is a summary. It may not cover all possible information. If you have questions about this medicine, talk to your doctor, pharmacist, or health care provider.  2020 Elsevier/Gold Standard (2017-12-30 13:22:01)

## 2019-08-19 NOTE — Progress Notes (Signed)
ANNUAL PREVENTATIVE CARE GYN  ENCOUNTER NOTE  Subjective:       Cindy Hart is a 24 y.o. G0P0000 female here for a routine annual gynecologic exam.  Current complaints: 1. Questions regarding IUD placement due to mood changes with OCP  Denies difficulty breathing or respiratory distress, chest pain, abdominal pain, excessive vaginal bleeding, dysuria, and leg pain or swelling.    Gynecologic History  Patient's last menstrual period was 07/22/2019 (approximate). Period Cycle (Days): 28 Period Duration (Days): 4 Period Pattern: Regular Menstrual Flow: Light, Moderate, Heavy Menstrual Control: Tampon Menstrual Control Change Freq (Hours): 2-3 Dysmenorrhea: (!) Mild Dysmenorrhea Symptoms: Cramping, Headache  Contraception: OCP (estrogen/progesterone)  Last Pap: 08/2018. Results were: normal  Obstetric History  OB History  Gravida Para Term Preterm AB Living  0 0 0 0 0 0  SAB TAB Ectopic Multiple Live Births  0 0 0 0 0    Past Medical History:  Diagnosis Date  . Anemia 2018   infusions - improved  . Exercise-induced asthma    none recently  . HSV-1 (herpes simplex virus 1) infection   . Migraine headache    3-4x/wk  . Motion sickness back seat cars  . Wears contact lenses     Past Surgical History:  Procedure Laterality Date  . TONSILLECTOMY N/A 05/08/2017   Procedure: TONSILLECTOMY;  Surgeon: Linus Salmons, MD;  Location: [facility];  Service: ENT;  Laterality: N/A;  . WISDOM TOOTH EXTRACTION      Current Outpatient Medications on File Prior to Visit  Medication Sig Dispense Refill  . albuterol (VENTOLIN HFA) 108 (90 Base) MCG/ACT inhaler Inhale 2 puffs into the lungs every 4 (four) hours as needed.    . fexofenadine (ALLEGRA ALLERGY) 180 MG tablet Take 2 tablets daily for 2 weeks. Then decrease to 1 tablet daily. 60 tablet 2  . levocetirizine (XYZAL) 5 MG tablet SMARTSIG:1 Tablet(s) By Mouth Every Evening    . valACYclovir (VALTREX) 1000 MG tablet  TAKE 1 TABLET BY MOUTH TWICE DAILY FOR 10 DAYS AS DIRECTED 30 tablet 1  . famotidine (PEPCID) 20 MG tablet Take 1 tablet (20 mg total) by mouth at bedtime for 14 days. 14 tablet 0   No current facility-administered medications on file prior to visit.    Allergies  Allergen Reactions  . Feraheme [Ferumoxytol] Anaphylaxis    Social History   Socioeconomic History  . Marital status: Single    Spouse name: Not on file  . Number of children: Not on file  . Years of education: Not on file  . Highest education level: Not on file  Occupational History  . Not on file  Tobacco Use  . Smoking status: Never Smoker  . Smokeless tobacco: Never Used  Vaping Use  . Vaping Use: Never used  Substance and Sexual Activity  . Alcohol use: No    Alcohol/week: 0.0 standard drinks  . Drug use: No  . Sexual activity: Yes    Birth control/protection: Pill  Other Topics Concern  . Not on file  Social History Narrative  . Not on file   Social Determinants of Health   Financial Resource Strain:   . Difficulty of Paying Living Expenses:   Food Insecurity:   . Worried About Programme researcher, broadcasting/film/video in the Last Year:   . Barista in the Last Year:   Transportation Needs:   . Freight forwarder (Medical):   Marland Kitchen Lack of Transportation (Non-Medical):   Physical Activity:   .  Days of Exercise per Week:   . Minutes of Exercise per Session:   Stress:   . Feeling of Stress :   Social Connections:   . Frequency of Communication with Friends and Family:   . Frequency of Social Gatherings with Friends and Family:   . Attends Religious Services:   . Active Member of Clubs or Organizations:   . Attends Archivist Meetings:   Marland Kitchen Marital Status:   Intimate Partner Violence:   . Fear of Current or Ex-Partner:   . Emotionally Abused:   Marland Kitchen Physically Abused:   . Sexually Abused:     Family History  Problem Relation Age of Onset  . Thyroid disease Mother   . Diabetes Maternal  Grandmother   . Heart disease Maternal Grandmother   . Hypertension Maternal Grandfather   . Atrial fibrillation Paternal Grandmother     The following portions of the patient's history were reviewed and updated as appropriate: allergies, current medications, past family history, past medical history, past social history, past surgical history and problem list.  Review of Systems  ROS negative except as noted above. Information obtained from patient.    Objective:   BP 106/65   Pulse 71   Ht 5\' 7"  (1.702 m)   Wt 144 lb 5 oz (65.5 kg)   LMP 07/22/2019 (Approximate)   BMI 22.60 kg/m    CONSTITUTIONAL: Well-developed, well-nourished female in no acute distress.   PSYCHIATRIC: Normal mood and affect. Normal behavior. Normal judgment and thought content.  [place]: Alert and oriented to person, place, and time. Normal muscle tone coordination. No cranial nerve deficit noted.  HENT:  Normocephalic, atraumatic, External right and left ear normal.   EYES: Conjunctivae and EOM are normal. Pupils are equal and round.   NECK: Normal range of motion, supple, no masses.  Normal thyroid.   SKIN: Skin is warm and dry. No rash noted. Not diaphoretic. No erythema. No pallor.  CARDIOVASCULAR: Normal heart rate noted, regular rhythm, no murmur.  RESPIRATORY: Clear to auscultation bilaterally. Effort and breath sounds normal, no problems with respiration noted.  BREASTS: Symmetric in size. No masses, skin changes, nipple drainage, or lymphadenopathy.  ABDOMEN: Soft, normal bowel sounds, no distention noted.  No tenderness, rebound or guarding.   PELVIC:  External Genitalia: Normal   MUSCULOSKELETAL: Normal range of motion. No tenderness.  No cyanosis, clubbing, or edema.  2+ distal pulses.  LYMPHATIC: No Axillary, Supraclavicular, or Inguinal Adenopathy.  Assessment:   Annual gynecologic examination 24 y.o.   Contraception: OCP (estrogen/progesterone)   Normal BMI   Problem  List Items Addressed This Visit    None    Visit Diagnoses    Well woman exam    -  Primary   Surveillance for birth control, oral contraceptives       Encounter for counseling regarding contraception          Plan:   Pap: Not needed  Labs: Declined   Routine preventative health maintenance measures emphasized: Exercise/Diet/Weight control, Tobacco Warnings, Alcohol/Substance use risks, Stress Management, Peer Pressure Issues and Safe Sex; see AVS  Education given regarding options for contraception, including barrier methods, injectable contraception, IUD placement, oral contraceptives; see AVS  Patient considering Mirena IUD placement, will contact office via MyChart if desired  Rx: Tri Previfem, see orders  Reviewed red flag symptoms and when to call  Return to Clinic - 1 Year for [employer] or sooner if needed   Dani Gobble, CNM  [facility], [facility] 08/19/19 11:40 AM

## 2019-10-08 ENCOUNTER — Ambulatory Visit: Payer: BC Managed Care – PPO | Admitting: Certified Nurse Midwife

## 2019-10-08 ENCOUNTER — Encounter: Payer: Self-pay | Admitting: Certified Nurse Midwife

## 2019-10-08 ENCOUNTER — Other Ambulatory Visit: Payer: Self-pay

## 2019-10-08 VITALS — BP 109/68 | HR 72 | Wt 139.3 lb

## 2019-10-08 DIAGNOSIS — Z30430 Encounter for insertion of intrauterine contraceptive device: Secondary | ICD-10-CM | POA: Diagnosis not present

## 2019-10-08 NOTE — Progress Notes (Signed)
Cindy Hart is a 24 y.o. year old G0P0000 Caucasian female who presents for placement of a Mirena IUD.   BP 109/68   Pulse 72   Wt 139 lb 4.8 oz (63.2 kg)   LMP 10/06/2019   BMI 21.82 kg/m    Pregnancy test today was negative.   The risks and benefits of the method and placement have been thouroughly reviewed with the patient and all questions were answered. Specifically the patient is aware of failure rate of 03/998, expulsion of the IUD and of possible perforation.  The patient is aware of irregular bleeding due to the method and understands the incidence of irregular bleeding diminishes with time.  Signed copy of informed consent in chart.   Time out was performed.  A small plastic speculum was placed in the vagina.  The cervix was visualized, prepped using Betadine, and grasped with a single tooth tenaculum. The uterus was sounded to 7 cm.  Mirena IUD placed per manufacturer's recommendations.   The strings were trimmed to 3 cm.  The patient was given post procedure instructions, including signs and symptoms of infection and to check for the strings after each menses or each month, and refraining from intercourse or anything in the vagina for 3 days.  She was given a Mirena care card with date Mirena placed, and date Mirena to be removed.  Reviewed red flag symptoms and when to call.   RTC x 6-8 weeks for IUD string check or sooner if needed.    Serafina Royals, CNM [facility], [facility] 10/08/19 11:40 AM

## 2019-10-08 NOTE — Patient Instructions (Signed)
IUD PLACEMENT POST-PROCEDURE INSTRUCTIONS  1. You may take Ibuprofen, Aleve or Tylenol for pain if needed.  Cramping should resolve within in 24 hours.  2. You may have a small amount of spotting.  You should wear a mini pad for the next few days.  3. You may have intercourse after 48 hours.  If you using this for birth control, it is effective immediately.  4. You need to call if you have any pelvic pain, fever, heavy bleeding or foul smelling vaginal discharge.  Irregular bleeding is common the first several months after having an IUD placed. You do not need to call for this reason unless you are concerned.  5. Shower or bathe as normal  6. You should have a follow-up appointment in 4-8 weeks for a re-check to make sure you are not having any problems.    Levonorgestrel intrauterine device (IUD) What is this medicine? LEVONORGESTREL IUD (LEE voe nor jes trel) is a contraceptive (birth control) device. The device is placed inside the uterus by a healthcare professional. It is used to prevent pregnancy. This device can also be used to treat heavy bleeding that occurs during your period. This medicine may be used for other purposes; ask your health care provider or pharmacist if you have questions. COMMON BRAND NAME(S): Cameron Ali What should I tell my health care provider before I take this medicine? They need to know if you have any of these conditions:  abnormal Pap smear  cancer of the breast, uterus, or cervix  diabetes  endometritis  genital or pelvic infection now or in the past  have more than one sexual partner or your partner has more than one partner  heart disease  history of an ectopic or tubal pregnancy  immune system problems  IUD in place  liver disease or tumor  problems with blood clots or take blood-thinners  seizures  use intravenous drugs  uterus of unusual shape  vaginal bleeding that has not been explained  an unusual  or allergic reaction to levonorgestrel, other hormones, silicone, or polyethylene, medicines, foods, dyes, or preservatives  pregnant or trying to get pregnant  breast-feeding How should I use this medicine? This device is placed inside the uterus by a health care professional. Talk to your pediatrician regarding the use of this medicine in children. Special care may be needed. Overdosage: If you think you have taken too much of this medicine contact a poison control center or emergency room at once. NOTE: This medicine is only for you. Do not share this medicine with others. What if I miss a dose? This does not apply. Depending on the brand of device you have inserted, the device will need to be replaced every 3 to 6 years if you wish to continue using this type of birth control. What may interact with this medicine? Do not take this medicine with any of the following medications:  amprenavir  bosentan  fosamprenavir This medicine may also interact with the following medications:  aprepitant  armodafinil  barbiturate medicines for inducing sleep or treating seizures  bexarotene  boceprevir  griseofulvin  medicines to treat seizures like carbamazepine, ethotoin, felbamate, oxcarbazepine, phenytoin, topiramate  modafinil  pioglitazone  rifabutin  rifampin  rifapentine  some medicines to treat HIV infection like atazanavir, efavirenz, indinavir, lopinavir, nelfinavir, tipranavir, ritonavir  St. John's wort  warfarin This list may not describe all possible interactions. Give your health care provider a list of all the medicines, herbs, non-prescription drugs,  or dietary supplements you use. Also tell them if you smoke, drink alcohol, or use illegal drugs. Some items may interact with your medicine. What should I watch for while using this medicine? Visit your doctor or health care professional for regular check ups. See your doctor if you or your partner has sexual  contact with others, becomes HIV positive, or gets a sexual transmitted disease. This product does not protect you against HIV infection (AIDS) or other sexually transmitted diseases. You can check the placement of the IUD yourself by reaching up to the top of your vagina with clean fingers to feel the threads. Do not pull on the threads. It is a good habit to check placement after each menstrual period. Call your doctor right away if you feel more of the IUD than just the threads or if you cannot feel the threads at all. The IUD may come out by itself. You may become pregnant if the device comes out. If you notice that the IUD has come out use a backup birth control method like condoms and call your health care provider. Using tampons will not change the position of the IUD and are okay to use during your period. This IUD can be safely scanned with magnetic resonance imaging (MRI) only under specific conditions. Before you have an MRI, tell your healthcare provider that you have an IUD in place, and which type of IUD you have in place. What side effects may I notice from receiving this medicine? Side effects that you should report to your doctor or health care professional as soon as possible:  allergic reactions like skin rash, itching or hives, swelling of the face, lips, or tongue  fever, flu-like symptoms  genital sores  high blood pressure  no menstrual period for 6 weeks during use  pain, swelling, warmth in the leg  pelvic pain or tenderness  severe or sudden headache  signs of pregnancy  stomach cramping  sudden shortness of breath  trouble with balance, talking, or walking  unusual vaginal bleeding, discharge  yellowing of the eyes or skin Side effects that usually do not require medical attention (report to your doctor or health care professional if they continue or are bothersome):  acne  breast pain  change in sex drive or performance  changes in  weight  cramping, dizziness, or faintness while the device is being inserted  headache  irregular menstrual bleeding within first 3 to 6 months of use  nausea This list may not describe all possible side effects. Call your doctor for medical advice about side effects. You may report side effects to FDA at 1-800-FDA-1088. Where should I keep my medicine? This does not apply. NOTE: This sheet is a summary. It may not cover all possible information. If you have questions about this medicine, talk to your doctor, pharmacist, or health care provider.  2020 Elsevier/Gold Standard (2017-12-30 13:22:01)

## 2019-10-08 NOTE — Progress Notes (Signed)
Pt present for IUD insertion. Pt would like to discuss about her ocp ortho.

## 2019-11-19 ENCOUNTER — Ambulatory Visit: Payer: BC Managed Care – PPO | Admitting: Certified Nurse Midwife

## 2019-11-19 ENCOUNTER — Encounter: Payer: Self-pay | Admitting: Certified Nurse Midwife

## 2019-11-19 ENCOUNTER — Other Ambulatory Visit: Payer: Self-pay

## 2019-11-19 VITALS — BP 106/63 | HR 70 | Ht 67.0 in | Wt 134.5 lb

## 2019-11-19 DIAGNOSIS — Z975 Presence of (intrauterine) contraceptive device: Secondary | ICD-10-CM | POA: Diagnosis not present

## 2019-11-19 DIAGNOSIS — Z30431 Encounter for routine checking of intrauterine contraceptive device: Secondary | ICD-10-CM

## 2019-11-19 NOTE — Patient Instructions (Signed)
Levonorgestrel intrauterine device (IUD) What is this medicine? LEVONORGESTREL IUD (LEE voe nor jes trel) is a contraceptive (birth control) device. The device is placed inside the uterus by a healthcare professional. It is used to prevent pregnancy. This device can also be used to treat heavy bleeding that occurs during your period. This medicine may be used for other purposes; ask your health care provider or pharmacist if you have questions. COMMON BRAND NAME(S): Minette Headland What should I tell my health care provider before I take this medicine? They need to know if you have any of these conditions:  abnormal Pap smear  cancer of the breast, uterus, or cervix  diabetes  endometritis  genital or pelvic infection now or in the past  have more than one sexual partner or your partner has more than one partner  heart disease  history of an ectopic or tubal pregnancy  immune system problems  IUD in place  liver disease or tumor  problems with blood clots or take blood-thinners  seizures  use intravenous drugs  uterus of unusual shape  vaginal bleeding that has not been explained  an unusual or allergic reaction to levonorgestrel, other hormones, silicone, or polyethylene, medicines, foods, dyes, or preservatives  pregnant or trying to get pregnant  breast-feeding How should I use this medicine? This device is placed inside the uterus by a health care professional. Talk to your pediatrician regarding the use of this medicine in children. Special care may be needed. Overdosage: If you think you have taken too much of this medicine contact a poison control center or emergency room at once. NOTE: This medicine is only for you. Do not share this medicine with others. What if I miss a dose? This does not apply. Depending on the brand of device you have inserted, the device will need to be replaced every 3 to 6 years if you wish to continue using this type  of birth control. What may interact with this medicine? Do not take this medicine with any of the following medications:  amprenavir  bosentan  fosamprenavir This medicine may also interact with the following medications:  aprepitant  armodafinil  barbiturate medicines for inducing sleep or treating seizures  bexarotene  boceprevir  griseofulvin  medicines to treat seizures like carbamazepine, ethotoin, felbamate, oxcarbazepine, phenytoin, topiramate  modafinil  pioglitazone  rifabutin  rifampin  rifapentine  some medicines to treat HIV infection like atazanavir, efavirenz, indinavir, lopinavir, nelfinavir, tipranavir, ritonavir  St. John's wort  warfarin This list may not describe all possible interactions. Give your health care provider a list of all the medicines, herbs, non-prescription drugs, or dietary supplements you use. Also tell them if you smoke, drink alcohol, or use illegal drugs. Some items may interact with your medicine. What should I watch for while using this medicine? Visit your doctor or health care professional for regular check ups. See your doctor if you or your partner has sexual contact with others, becomes HIV positive, or gets a sexual transmitted disease. This product does not protect you against HIV infection (AIDS) or other sexually transmitted diseases. You can check the placement of the IUD yourself by reaching up to the top of your vagina with clean fingers to feel the threads. Do not pull on the threads. It is a good habit to check placement after each menstrual period. Call your doctor right away if you feel more of the IUD than just the threads or if you cannot feel the threads at  all. The IUD may come out by itself. You may become pregnant if the device comes out. If you notice that the IUD has come out use a backup birth control method like condoms and call your health care provider. Using tampons will not change the position of the  IUD and are okay to use during your period. This IUD can be safely scanned with magnetic resonance imaging (MRI) only under specific conditions. Before you have an MRI, tell your healthcare provider that you have an IUD in place, and which type of IUD you have in place. What side effects may I notice from receiving this medicine? Side effects that you should report to your doctor or health care professional as soon as possible:  allergic reactions like skin rash, itching or hives, swelling of the face, lips, or tongue  fever, flu-like symptoms  genital sores  high blood pressure  no menstrual period for 6 weeks during use  pain, swelling, warmth in the leg  pelvic pain or tenderness  severe or sudden headache  signs of pregnancy  stomach cramping  sudden shortness of breath  trouble with balance, talking, or walking  unusual vaginal bleeding, discharge  yellowing of the eyes or skin Side effects that usually do not require medical attention (report to your doctor or health care professional if they continue or are bothersome):  acne  breast pain  change in sex drive or performance  changes in weight  cramping, dizziness, or faintness while the device is being inserted  headache  irregular menstrual bleeding within first 3 to 6 months of use  nausea This list may not describe all possible side effects. Call your doctor for medical advice about side effects. You may report side effects to FDA at 1-800-FDA-1088. Where should I keep my medicine? This does not apply. NOTE: This sheet is a summary. It may not cover all possible information. If you have questions about this medicine, talk to your doctor, pharmacist, or health care provider.  2020 Elsevier/Gold Standard (2017-12-30 13:22:01)   Preventive Care 55-65 Years Old, Female Preventive care refers to visits with your health care provider and lifestyle choices that can promote health and wellness. This  includes:  A yearly physical exam. This may also be called an annual well check.  Regular dental visits and eye exams.  Immunizations.  Screening for certain conditions.  Healthy lifestyle choices, such as eating a healthy diet, getting regular exercise, not using drugs or products that contain nicotine and tobacco, and limiting alcohol use. What can I expect for my preventive care visit? Physical exam Your health care provider will check your:  Height and weight. This may be used to calculate body mass index (BMI), which tells if you are at a healthy weight.  Heart rate and blood pressure.  Skin for abnormal spots. Counseling Your health care provider may ask you questions about your:  Alcohol, tobacco, and drug use.  Emotional well-being.  Home and relationship well-being.  Sexual activity.  Eating habits.  Work and work Statistician.  Method of birth control.  Menstrual cycle.  Pregnancy history. What immunizations do I need?  Influenza (flu) vaccine  This is recommended every year. Tetanus, diphtheria, and pertussis (Tdap) vaccine  You may need a Td booster every 10 years. Varicella (chickenpox) vaccine  You may need this if you have not been vaccinated. Human papillomavirus (HPV) vaccine  If recommended by your health care provider, you may need three doses over 6 months. Measles, mumps, and  rubella (MMR) vaccine  You may need at least one dose of MMR. You may also need a second dose. Meningococcal conjugate (MenACWY) vaccine  One dose is recommended if you are age 100-21 years and a first-year college student living in a residence hall, or if you have one of several medical conditions. You may also need additional booster doses. Pneumococcal conjugate (PCV13) vaccine  You may need this if you have certain conditions and were not previously vaccinated. Pneumococcal polysaccharide (PPSV23) vaccine  You may need one or two doses if you smoke  cigarettes or if you have certain conditions. Hepatitis A vaccine  You may need this if you have certain conditions or if you travel or work in places where you may be exposed to hepatitis A. Hepatitis B vaccine  You may need this if you have certain conditions or if you travel or work in places where you may be exposed to hepatitis B. Haemophilus influenzae type b (Hib) vaccine  You may need this if you have certain conditions. You may receive vaccines as individual doses or as more than one vaccine together in one shot (combination vaccines). Talk with your health care provider about the risks and benefits of combination vaccines. What tests do I need?  Blood tests  Lipid and cholesterol levels. These may be checked every 5 years starting at age 85.  Hepatitis C test.  Hepatitis B test. Screening  Diabetes screening. This is done by checking your blood sugar (glucose) after you have not eaten for a while (fasting).  Sexually transmitted disease (STD) testing.  BRCA-related cancer screening. This may be done if you have a family history of breast, ovarian, tubal, or peritoneal cancers.  Pelvic exam and Pap test. This may be done every 3 years starting at age 22. Starting at age 37, this may be done every 5 years if you have a Pap test in combination with an HPV test. Talk with your health care provider about your test results, treatment options, and if necessary, the need for more tests. Follow these instructions at home: Eating and drinking   Eat a diet that includes fresh fruits and vegetables, whole grains, lean protein, and low-fat dairy.  Take vitamin and mineral supplements as recommended by your health care provider.  Do not drink alcohol if: ? Your health care provider tells you not to drink. ? You are pregnant, may be pregnant, or are planning to become pregnant.  If you drink alcohol: ? Limit how much you have to 0-1 drink a day. ? Be aware of how much alcohol  is in your drink. In the [place] one drink equals one 12 oz bottle of beer (355 mL), one 5 oz glass of wine (148 mL), or one 1 oz glass of hard liquor (44 mL). Lifestyle  Take daily care of your teeth and gums.  Stay active. Exercise for at least 30 minutes on 5 or more days each week.  Do not use any products that contain nicotine or tobacco, such as cigarettes, e-cigarettes, and chewing tobacco. If you need help quitting, ask your health care provider.  If you are sexually active, practice safe sex. Use a condom or other form of birth control (contraception) in order to prevent pregnancy and STIs (sexually transmitted infections). If you plan to become pregnant, see your health care provider for a preconception visit. What's next?  Visit your health care provider once a year for a well check visit.  Ask your health care provider how  often you should have your eyes and teeth checked.  Stay up to date on all vaccines. This information is not intended to replace advice given to you by your health care provider. Make sure you discuss any questions you have with your health care provider. Document Revised: 10/30/2017 Document Reviewed: 10/30/2017 Elsevier Patient Education  2020 Elsevier Inc.  

## 2019-11-19 NOTE — Progress Notes (Signed)
  GYNECOLOGY OFFICE ENCOUNTER NOTE  History:  24 y.o. G0P0000 here today for today for IUD string check; Mirena  IUD was placed  10/08/2019. No complaints about the IUD, no concerning side effects.  Denies difficulty breathing or respiratory distress, chest pain, abdominal pain, excessive vaginal bleeding, dysuria, and leg pain or swelling.   The following portions of the patient's history were reviewed and updated as appropriate: allergies, current medications, past family history, past medical history, past social history, past surgical history and problem list. Last pap smear on 08/2018 was normal.  Review of Systems:   Pertinent items are noted in HPI.  Objective:   Blood pressure 106/63, pulse 70, height 5\' 7"  (1.702 m), weight 134 lb 8 oz (61 kg).   Physical Exam  CONSTITUTIONAL: Well-developed, well-nourished female in no acute distress.   ABDOMEN: Soft, no distention noted.    PELVIC: Normal appearing external genitalia; normal appearing vaginal mucosa and cervix.  IUD strings visualized, about three (3) cm in length outside cervix.   Assessment & Plan:  Patient to keep IUD in place for up to seven years; can come in for removal if she desires pregnancy earlier or for any concerning side effects.  Encouraged routine health maintenance techniques.   Reviewed red flag symptoms and when to call.   RTC as previously scheduled for [facility] or sooner if needed.    [facility], CNM [facility], [facility] 11/19/19 4:38 PM

## 2019-12-07 DIAGNOSIS — J3081 Allergic rhinitis due to animal (cat) (dog) hair and dander: Secondary | ICD-10-CM | POA: Diagnosis not present

## 2019-12-07 DIAGNOSIS — J301 Allergic rhinitis due to pollen: Secondary | ICD-10-CM | POA: Diagnosis not present

## 2019-12-07 DIAGNOSIS — J3089 Other allergic rhinitis: Secondary | ICD-10-CM | POA: Diagnosis not present

## 2019-12-09 DIAGNOSIS — J3081 Allergic rhinitis due to animal (cat) (dog) hair and dander: Secondary | ICD-10-CM | POA: Diagnosis not present

## 2019-12-09 DIAGNOSIS — J301 Allergic rhinitis due to pollen: Secondary | ICD-10-CM | POA: Diagnosis not present

## 2019-12-09 DIAGNOSIS — J3089 Other allergic rhinitis: Secondary | ICD-10-CM | POA: Diagnosis not present

## 2019-12-14 DIAGNOSIS — J3089 Other allergic rhinitis: Secondary | ICD-10-CM | POA: Diagnosis not present

## 2019-12-14 DIAGNOSIS — J301 Allergic rhinitis due to pollen: Secondary | ICD-10-CM | POA: Diagnosis not present

## 2019-12-14 DIAGNOSIS — J3081 Allergic rhinitis due to animal (cat) (dog) hair and dander: Secondary | ICD-10-CM | POA: Diagnosis not present

## 2019-12-21 DIAGNOSIS — J3081 Allergic rhinitis due to animal (cat) (dog) hair and dander: Secondary | ICD-10-CM | POA: Diagnosis not present

## 2019-12-21 DIAGNOSIS — J3089 Other allergic rhinitis: Secondary | ICD-10-CM | POA: Diagnosis not present

## 2019-12-21 DIAGNOSIS — J301 Allergic rhinitis due to pollen: Secondary | ICD-10-CM | POA: Diagnosis not present

## 2019-12-28 DIAGNOSIS — J3081 Allergic rhinitis due to animal (cat) (dog) hair and dander: Secondary | ICD-10-CM | POA: Diagnosis not present

## 2019-12-28 DIAGNOSIS — J3089 Other allergic rhinitis: Secondary | ICD-10-CM | POA: Diagnosis not present

## 2019-12-28 DIAGNOSIS — J301 Allergic rhinitis due to pollen: Secondary | ICD-10-CM | POA: Diagnosis not present

## 2020-01-04 DIAGNOSIS — J3089 Other allergic rhinitis: Secondary | ICD-10-CM | POA: Diagnosis not present

## 2020-01-04 DIAGNOSIS — J3081 Allergic rhinitis due to animal (cat) (dog) hair and dander: Secondary | ICD-10-CM | POA: Diagnosis not present

## 2020-01-04 DIAGNOSIS — J301 Allergic rhinitis due to pollen: Secondary | ICD-10-CM | POA: Diagnosis not present

## 2020-01-11 DIAGNOSIS — J301 Allergic rhinitis due to pollen: Secondary | ICD-10-CM | POA: Diagnosis not present

## 2020-01-11 DIAGNOSIS — J3081 Allergic rhinitis due to animal (cat) (dog) hair and dander: Secondary | ICD-10-CM | POA: Diagnosis not present

## 2020-01-11 DIAGNOSIS — J3089 Other allergic rhinitis: Secondary | ICD-10-CM | POA: Diagnosis not present

## 2020-01-18 DIAGNOSIS — J301 Allergic rhinitis due to pollen: Secondary | ICD-10-CM | POA: Diagnosis not present

## 2020-01-18 DIAGNOSIS — J3081 Allergic rhinitis due to animal (cat) (dog) hair and dander: Secondary | ICD-10-CM | POA: Diagnosis not present

## 2020-01-18 DIAGNOSIS — J3089 Other allergic rhinitis: Secondary | ICD-10-CM | POA: Diagnosis not present

## 2020-02-10 ENCOUNTER — Other Ambulatory Visit: Payer: Self-pay | Admitting: Allergy and Immunology

## 2020-02-10 DIAGNOSIS — J301 Allergic rhinitis due to pollen: Secondary | ICD-10-CM | POA: Diagnosis not present

## 2020-02-10 DIAGNOSIS — J3081 Allergic rhinitis due to animal (cat) (dog) hair and dander: Secondary | ICD-10-CM | POA: Diagnosis not present

## 2020-02-10 DIAGNOSIS — J3089 Other allergic rhinitis: Secondary | ICD-10-CM | POA: Diagnosis not present

## 2020-02-17 DIAGNOSIS — J3089 Other allergic rhinitis: Secondary | ICD-10-CM | POA: Diagnosis not present

## 2020-02-17 DIAGNOSIS — J3081 Allergic rhinitis due to animal (cat) (dog) hair and dander: Secondary | ICD-10-CM | POA: Diagnosis not present

## 2020-02-17 DIAGNOSIS — J301 Allergic rhinitis due to pollen: Secondary | ICD-10-CM | POA: Diagnosis not present

## 2020-02-24 DIAGNOSIS — J301 Allergic rhinitis due to pollen: Secondary | ICD-10-CM | POA: Diagnosis not present

## 2020-02-24 DIAGNOSIS — J3081 Allergic rhinitis due to animal (cat) (dog) hair and dander: Secondary | ICD-10-CM | POA: Diagnosis not present

## 2020-02-24 DIAGNOSIS — J3089 Other allergic rhinitis: Secondary | ICD-10-CM | POA: Diagnosis not present

## 2020-03-02 DIAGNOSIS — J3089 Other allergic rhinitis: Secondary | ICD-10-CM | POA: Diagnosis not present

## 2020-03-02 DIAGNOSIS — J3081 Allergic rhinitis due to animal (cat) (dog) hair and dander: Secondary | ICD-10-CM | POA: Diagnosis not present

## 2020-03-02 DIAGNOSIS — J301 Allergic rhinitis due to pollen: Secondary | ICD-10-CM | POA: Diagnosis not present

## 2020-03-10 DIAGNOSIS — J301 Allergic rhinitis due to pollen: Secondary | ICD-10-CM | POA: Diagnosis not present

## 2020-03-10 DIAGNOSIS — J3081 Allergic rhinitis due to animal (cat) (dog) hair and dander: Secondary | ICD-10-CM | POA: Diagnosis not present

## 2020-03-10 DIAGNOSIS — J3089 Other allergic rhinitis: Secondary | ICD-10-CM | POA: Diagnosis not present

## 2020-03-17 DIAGNOSIS — J301 Allergic rhinitis due to pollen: Secondary | ICD-10-CM | POA: Diagnosis not present

## 2020-03-17 DIAGNOSIS — J3081 Allergic rhinitis due to animal (cat) (dog) hair and dander: Secondary | ICD-10-CM | POA: Diagnosis not present

## 2020-03-17 DIAGNOSIS — J3089 Other allergic rhinitis: Secondary | ICD-10-CM | POA: Diagnosis not present

## 2020-03-23 DIAGNOSIS — J301 Allergic rhinitis due to pollen: Secondary | ICD-10-CM | POA: Diagnosis not present

## 2020-03-23 DIAGNOSIS — J3081 Allergic rhinitis due to animal (cat) (dog) hair and dander: Secondary | ICD-10-CM | POA: Diagnosis not present

## 2020-03-23 DIAGNOSIS — J3089 Other allergic rhinitis: Secondary | ICD-10-CM | POA: Diagnosis not present

## 2020-03-30 DIAGNOSIS — J3089 Other allergic rhinitis: Secondary | ICD-10-CM | POA: Diagnosis not present

## 2020-03-30 DIAGNOSIS — J3081 Allergic rhinitis due to animal (cat) (dog) hair and dander: Secondary | ICD-10-CM | POA: Diagnosis not present

## 2020-03-30 DIAGNOSIS — J301 Allergic rhinitis due to pollen: Secondary | ICD-10-CM | POA: Diagnosis not present

## 2020-04-07 DIAGNOSIS — J3081 Allergic rhinitis due to animal (cat) (dog) hair and dander: Secondary | ICD-10-CM | POA: Diagnosis not present

## 2020-04-07 DIAGNOSIS — J301 Allergic rhinitis due to pollen: Secondary | ICD-10-CM | POA: Diagnosis not present

## 2020-04-07 DIAGNOSIS — J3089 Other allergic rhinitis: Secondary | ICD-10-CM | POA: Diagnosis not present

## 2020-04-20 DIAGNOSIS — J301 Allergic rhinitis due to pollen: Secondary | ICD-10-CM | POA: Diagnosis not present

## 2020-04-20 DIAGNOSIS — J3081 Allergic rhinitis due to animal (cat) (dog) hair and dander: Secondary | ICD-10-CM | POA: Diagnosis not present

## 2020-04-20 DIAGNOSIS — J3089 Other allergic rhinitis: Secondary | ICD-10-CM | POA: Diagnosis not present

## 2020-04-28 DIAGNOSIS — J3089 Other allergic rhinitis: Secondary | ICD-10-CM | POA: Diagnosis not present

## 2020-04-28 DIAGNOSIS — J301 Allergic rhinitis due to pollen: Secondary | ICD-10-CM | POA: Diagnosis not present

## 2020-04-28 DIAGNOSIS — J3081 Allergic rhinitis due to animal (cat) (dog) hair and dander: Secondary | ICD-10-CM | POA: Diagnosis not present

## 2020-05-02 DIAGNOSIS — J3089 Other allergic rhinitis: Secondary | ICD-10-CM | POA: Diagnosis not present

## 2020-05-02 DIAGNOSIS — J301 Allergic rhinitis due to pollen: Secondary | ICD-10-CM | POA: Diagnosis not present

## 2020-05-02 DIAGNOSIS — J3081 Allergic rhinitis due to animal (cat) (dog) hair and dander: Secondary | ICD-10-CM | POA: Diagnosis not present

## 2020-05-09 DIAGNOSIS — J3089 Other allergic rhinitis: Secondary | ICD-10-CM | POA: Diagnosis not present

## 2020-05-09 DIAGNOSIS — J301 Allergic rhinitis due to pollen: Secondary | ICD-10-CM | POA: Diagnosis not present

## 2020-05-09 DIAGNOSIS — J3081 Allergic rhinitis due to animal (cat) (dog) hair and dander: Secondary | ICD-10-CM | POA: Diagnosis not present

## 2020-05-18 DIAGNOSIS — J3081 Allergic rhinitis due to animal (cat) (dog) hair and dander: Secondary | ICD-10-CM | POA: Diagnosis not present

## 2020-05-18 DIAGNOSIS — J3089 Other allergic rhinitis: Secondary | ICD-10-CM | POA: Diagnosis not present

## 2020-05-18 DIAGNOSIS — J301 Allergic rhinitis due to pollen: Secondary | ICD-10-CM | POA: Diagnosis not present

## 2020-05-26 DIAGNOSIS — J3089 Other allergic rhinitis: Secondary | ICD-10-CM | POA: Diagnosis not present

## 2020-05-26 DIAGNOSIS — J301 Allergic rhinitis due to pollen: Secondary | ICD-10-CM | POA: Diagnosis not present

## 2020-05-26 DIAGNOSIS — J3081 Allergic rhinitis due to animal (cat) (dog) hair and dander: Secondary | ICD-10-CM | POA: Diagnosis not present

## 2020-06-01 DIAGNOSIS — J3089 Other allergic rhinitis: Secondary | ICD-10-CM | POA: Diagnosis not present

## 2020-06-01 DIAGNOSIS — J3081 Allergic rhinitis due to animal (cat) (dog) hair and dander: Secondary | ICD-10-CM | POA: Diagnosis not present

## 2020-06-01 DIAGNOSIS — J301 Allergic rhinitis due to pollen: Secondary | ICD-10-CM | POA: Diagnosis not present

## 2020-06-06 DIAGNOSIS — J3081 Allergic rhinitis due to animal (cat) (dog) hair and dander: Secondary | ICD-10-CM | POA: Diagnosis not present

## 2020-06-06 DIAGNOSIS — J3089 Other allergic rhinitis: Secondary | ICD-10-CM | POA: Diagnosis not present

## 2020-06-06 DIAGNOSIS — J301 Allergic rhinitis due to pollen: Secondary | ICD-10-CM | POA: Diagnosis not present

## 2020-06-20 DIAGNOSIS — J301 Allergic rhinitis due to pollen: Secondary | ICD-10-CM | POA: Diagnosis not present

## 2020-06-20 DIAGNOSIS — J3081 Allergic rhinitis due to animal (cat) (dog) hair and dander: Secondary | ICD-10-CM | POA: Diagnosis not present

## 2020-06-20 DIAGNOSIS — J3089 Other allergic rhinitis: Secondary | ICD-10-CM | POA: Diagnosis not present

## 2020-06-27 DIAGNOSIS — J301 Allergic rhinitis due to pollen: Secondary | ICD-10-CM | POA: Diagnosis not present

## 2020-06-27 DIAGNOSIS — J3081 Allergic rhinitis due to animal (cat) (dog) hair and dander: Secondary | ICD-10-CM | POA: Diagnosis not present

## 2020-06-27 DIAGNOSIS — J3089 Other allergic rhinitis: Secondary | ICD-10-CM | POA: Diagnosis not present

## 2020-07-04 DIAGNOSIS — J3089 Other allergic rhinitis: Secondary | ICD-10-CM | POA: Diagnosis not present

## 2020-07-04 DIAGNOSIS — J301 Allergic rhinitis due to pollen: Secondary | ICD-10-CM | POA: Diagnosis not present

## 2020-07-04 DIAGNOSIS — J3081 Allergic rhinitis due to animal (cat) (dog) hair and dander: Secondary | ICD-10-CM | POA: Diagnosis not present

## 2020-07-11 DIAGNOSIS — J3081 Allergic rhinitis due to animal (cat) (dog) hair and dander: Secondary | ICD-10-CM | POA: Diagnosis not present

## 2020-07-11 DIAGNOSIS — L298 Other pruritus: Secondary | ICD-10-CM | POA: Diagnosis not present

## 2020-07-11 DIAGNOSIS — J3089 Other allergic rhinitis: Secondary | ICD-10-CM | POA: Diagnosis not present

## 2020-07-11 DIAGNOSIS — J301 Allergic rhinitis due to pollen: Secondary | ICD-10-CM | POA: Diagnosis not present

## 2020-07-18 DIAGNOSIS — J3089 Other allergic rhinitis: Secondary | ICD-10-CM | POA: Diagnosis not present

## 2020-07-18 DIAGNOSIS — J3081 Allergic rhinitis due to animal (cat) (dog) hair and dander: Secondary | ICD-10-CM | POA: Diagnosis not present

## 2020-07-18 DIAGNOSIS — J301 Allergic rhinitis due to pollen: Secondary | ICD-10-CM | POA: Diagnosis not present

## 2020-08-01 DIAGNOSIS — J3089 Other allergic rhinitis: Secondary | ICD-10-CM | POA: Diagnosis not present

## 2020-08-01 DIAGNOSIS — J301 Allergic rhinitis due to pollen: Secondary | ICD-10-CM | POA: Diagnosis not present

## 2020-08-01 DIAGNOSIS — J3081 Allergic rhinitis due to animal (cat) (dog) hair and dander: Secondary | ICD-10-CM | POA: Diagnosis not present

## 2020-08-02 ENCOUNTER — Encounter: Payer: Self-pay | Admitting: Oncology

## 2020-08-02 ENCOUNTER — Ambulatory Visit: Payer: 59 | Admitting: Adult Health

## 2020-08-02 ENCOUNTER — Other Ambulatory Visit: Payer: Self-pay

## 2020-08-02 ENCOUNTER — Encounter: Payer: Self-pay | Admitting: Adult Health

## 2020-08-02 VITALS — BP 114/68 | HR 78 | Temp 97.9°F | Ht 67.01 in | Wt 127.6 lb

## 2020-08-02 DIAGNOSIS — Z13220 Encounter for screening for lipoid disorders: Secondary | ICD-10-CM | POA: Diagnosis not present

## 2020-08-02 DIAGNOSIS — I7300 Raynaud's syndrome without gangrene: Secondary | ICD-10-CM

## 2020-08-02 DIAGNOSIS — Z1389 Encounter for screening for other disorder: Secondary | ICD-10-CM | POA: Diagnosis not present

## 2020-08-02 DIAGNOSIS — Z8619 Personal history of other infectious and parasitic diseases: Secondary | ICD-10-CM | POA: Diagnosis not present

## 2020-08-02 DIAGNOSIS — M2550 Pain in unspecified joint: Secondary | ICD-10-CM

## 2020-08-02 DIAGNOSIS — E559 Vitamin D deficiency, unspecified: Secondary | ICD-10-CM | POA: Diagnosis not present

## 2020-08-02 NOTE — Progress Notes (Signed)
New Patient Office Visit  Subjective:  Patient ID: Cindy Hart, female    DOB: 05/24/1995  Age: 25 y.o. MRN: [id number]  CC:  Chief Complaint  Patient presents with  . Transitions Of Care    HPI Cindy Hart presents for new patient care to this provider. Was a previous patient of Leanora CoverLauren Guse FNP  She reports she has trouble with her hands and feet turn white, when she is cold occasionally with hot weather. She has pictures of her white finger tips on her phone. Legs turn purple tinge at times and occasionally turn yet. This all started in January 2022. She has not had any lightheaded or dizziness.    She had Lyme's disease a couple of years ago and she was treated/. She still has joint pain.  Maternal aunt and maternal grandmother with Lupus.   Also seen at allergy asthma / Dr.  Eileen StanfordMeg Whelan. She doed receive allergy shots.  Margie BilletMichelle Lawnhorn is her OBGYN.  Up to date on PAP due next week she has this scheduled.   No LMP recorded. (Menstrual status: IUD).   Last PAP was 08/20/2018- within normal - due again 08/19/2021.   Patient  denies any fever, body aches,chills, rash, chest pain, shortness of breath, nausea, vomiting, or diarrhea.  Denies dizziness, lightheadedness, pre syncopal or syncopal episodes.   Past Medical History:  Diagnosis Date  . Allergies   . Anemia 2018   infusions - improved  . Exercise-induced asthma    none recently  . HSV-1 (herpes simplex virus 1) infection   . Migraine headache    3-4x/wk  . Motion sickness back seat cars  . Wears contact lenses     Past Surgical History:  Procedure Laterality Date  . TONSILLECTOMY N/A 05/08/2017   Procedure: TONSILLECTOMY;  Surgeon: Linus SalmonsMcQueen, Chapman, MD;  [facility];  Service: ENT;  Laterality: N/A;  . WISDOM TOOTH EXTRACTION      Family History  Problem Relation Age of Onset  . Thyroid disease Mother   . Diabetes Maternal Grandmother   . Heart disease Maternal Grandmother   .  Hypertension Maternal Grandfather   . Atrial fibrillation Paternal Grandmother     Social History   Socioeconomic History  . Marital status: Single    Spouse name: Not on file  . Number of children: Not on file  . Years of education: Not on file  . Highest education level: Not on file  Occupational History  . Not on file  Tobacco Use  . Smoking status: Never Smoker  . Smokeless tobacco: Never Used  Vaping Use  . Vaping Use: Never used  Substance and Sexual Activity  . Alcohol use: Yes    Alcohol/week: 0.0 standard drinks    Comment: socially   . Drug use: No  . Sexual activity: Yes    Birth control/protection: I.U.D.  Other Topics Concern  . Not on file  Social History Narrative  . Not on file   Social Determinants of Health   Financial Resource Strain: Not on file  Food Insecurity: Not on file  Transportation Needs: Not on file  Physical Activity: Not on file  Stress: Not on file  Social Connections: Not on file  Intimate Partner Violence: Not on file    ROS Review of Systems  Constitutional: Negative.   Respiratory: Negative.   Cardiovascular: Negative.   Gastrointestinal: Negative.   Genitourinary: Negative.   Musculoskeletal: Negative.   Skin: Positive for color change.  Objective:   Today's Vitals: BP 114/68   Pulse 78   Temp 97.9 F (36.6 C)   Ht 5' 7.01" (1.702 m)   Wt 127 lb 9.6 oz (57.9 kg)   SpO2 99%   BMI 19.98 kg/m   Physical Exam Vitals reviewed.  Constitutional:      General: She is not in acute distress.    Appearance: She is well-developed. She is not diaphoretic.     Interventions: She is not intubated. HENT:     Head: Normocephalic and atraumatic.     Right Ear: External ear normal.     Left Ear: External ear normal.     Nose: Nose normal.     Mouth/Throat:     Pharynx: No oropharyngeal exudate or posterior oropharyngeal erythema.  Eyes:     General: Lids are normal. No scleral icterus.       Right eye: No discharge.         Left eye: No discharge.     Conjunctiva/sclera: Conjunctivae normal.     Right eye: Right conjunctiva is not injected. No exudate or hemorrhage.    Left eye: Left conjunctiva is not injected. No exudate or hemorrhage.    Pupils: Pupils are equal, round, and reactive to light.  Neck:     Thyroid: No thyroid mass or thyromegaly.     Vascular: Normal carotid pulses. No carotid bruit, hepatojugular reflux or JVD.     Trachea: Trachea and phonation normal. No tracheal tenderness or tracheal deviation.     Meningeal: Brudzinski's sign and Kernig's sign absent.  Cardiovascular:     Rate and Rhythm: Normal rate and regular rhythm.     Pulses: Normal pulses.          Radial pulses are 2+ on the right side and 2+ on the left side.       Dorsalis pedis pulses are 2+ on the right side and 2+ on the left side.       Posterior tibial pulses are 2+ on the right side and 2+ on the left side.     Heart sounds: Normal heart sounds, S1 normal and S2 normal. Heart sounds not distant. No murmur heard. No friction rub. No gallop.   Pulmonary:     Effort: Pulmonary effort is normal. No tachypnea, bradypnea, accessory muscle usage or respiratory distress. She is not intubated.     Breath sounds: Normal breath sounds. No stridor. No wheezing, rhonchi or rales.  Chest:     Chest wall: No tenderness.  Breasts:     Right: No supraclavicular adenopathy.     Left: No supraclavicular adenopathy.    Abdominal:     General: Bowel sounds are normal. There is no distension or abdominal bruit.     Palpations: Abdomen is soft. There is no shifting dullness, fluid wave, hepatomegaly, splenomegaly, mass or pulsatile mass.     Tenderness: There is no abdominal tenderness. There is no guarding or rebound.     Hernia: No hernia is present.  Musculoskeletal:        General: No tenderness or deformity. Normal range of motion.     Cervical back: Full passive range of motion without pain, normal range of motion and neck  supple. No edema, erythema, rigidity or tenderness. No spinous process tenderness or muscular tenderness. Normal range of motion.  Lymphadenopathy:     Head:     Right side of head: No submental, submandibular, tonsillar, preauricular, posterior auricular or occipital adenopathy.  Left side of head: No submental, submandibular, tonsillar, preauricular, posterior auricular or occipital adenopathy.     Cervical: No cervical adenopathy.     Right cervical: No superficial, deep or posterior cervical adenopathy.    Left cervical: No superficial, deep or posterior cervical adenopathy.     Upper Body:     Right upper body: No supraclavicular or pectoral adenopathy.     Left upper body: No supraclavicular or pectoral adenopathy.  Skin:    General: Skin is warm and dry.     Coloration: Skin is not pale.     Findings: No abrasion, bruising, burn, ecchymosis, erythema, lesion, petechiae or rash.     Nails: There is no clubbing.  Neurological:     Mental Status: She is alert and oriented to person, place, and time.     GCS: GCS eye subscore is 4. GCS verbal subscore is 5. GCS motor subscore is 6.     Cranial Nerves: No cranial nerve deficit.     Sensory: No sensory deficit.     Motor: No weakness, tremor, atrophy, abnormal muscle tone or seizure activity.     Coordination: Coordination normal.     Gait: Gait normal.     Deep Tendon Reflexes: Reflexes are normal and symmetric. Reflexes normal. Babinski sign absent on the right side. Babinski sign absent on the left side.     Reflex Scores:      Tricep reflexes are 2+ on the right side and 2+ on the left side.      Bicep reflexes are 2+ on the right side and 2+ on the left side.      Brachioradialis reflexes are 2+ on the right side and 2+ on the left side.      Patellar reflexes are 2+ on the right side and 2+ on the left side.      Achilles reflexes are 2+ on the right side and 2+ on the left side. Psychiatric:        Mood and Affect: Mood  normal.        Speech: Speech normal.        Behavior: Behavior normal.        Thought Content: Thought content normal.        Judgment: Judgment normal.     Assessment & Plan:   Problem List Items Addressed This Visit      Other   Vitamin D deficiency - Primary   Relevant Orders   VITAMIN D 25 Hydroxy (Vit-D Deficiency, Fractures)   History of Lyme disease    Other Visit Diagnoses    Raynaud's disease without gangrene       Relevant Orders   Comprehensive metabolic panel   CBC with Differential/Platelet   TSH   C-reactive protein   Sedimentation rate   ANA,IFA RA Diag Pnl w/rflx Tit/Patn   Rheumatoid factor   B. burgdorfi antibodies   Arthralgia, unspecified joint       Screening cholesterol level       Relevant Orders   Lipid panel   Screening for blood or protein in urine       Relevant Orders   CULTURE, URINE COMPREHENSIVE   Urinalysis, Routine w reflex microscopic      Outpatient Encounter Medications as of 08/02/2020  Medication Sig  . albuterol (VENTOLIN HFA) 108 (90 Base) MCG/ACT inhaler Inhale 2 puffs into the lungs every 4 (four) hours as needed.  Marland Kitchen EPINEPHrine 0.3 mg/0.3 mL IJ SOAJ injection INJECT INTO THE  MUSCLE AS DIRECTED  . famotidine (PEPCID) 20 MG tablet Take 20 mg by mouth 2 (two) times daily.  . fexofenadine (ALLEGRA ALLERGY) 180 MG tablet Take 2 tablets daily for 2 weeks. Then decrease to 1 tablet daily.  Marland Kitchen levocetirizine (XYZAL) 5 MG tablet SMARTSIG:1 Tablet(s) By Mouth Every Evening  . [DISCONTINUED] levocetirizine (XYZAL) 5 MG tablet TAKE 1 TABLET BY MOUTH ONCE DAILY EVERY EVENING   No facility-administered encounter medications on file as of 08/02/2020.   1. Vitamin D deficiency Vitamin D lab, given she is taking and has joint pain.   2. History of Lyme disease Will check antibodies, no recent tick bite reported.   3. Raynaud's disease without gangrene Labs today, discussed etiology.  - Comprehensive metabolic panel; Future - CBC with  Differential/Platelet; Future - TSH; Future - C-reactive protein; Future - Sedimentation rate; Future - ANA,IFA RA Diag Pnl w/rflx Tit/Patn; Future - Rheumatoid factor; Future - B. burgdorfi antibodies; Future  4. Arthralgia, unspecified joint Will check labs, maternal aunt and grandmother with Lupus.   5. Screening cholesterol level - Lipid panel; Future  6. Screening for blood or protein in urine - CULTURE, URINE COMPREHENSIVE; Future - Urinalysis, Routine w reflex microscopic; Future  Follow  Up after labs, keep OBGYN appointment for women's care.    Red Flags discussed. The patient was given clear instructions to go to ER or return to medical center if any red flags develop, symptoms do not improve, worsen or new problems develop. They verbalized understanding.  Follow-up: Return in 3 months (on 11/02/2020), or if symptoms worsen or fail to improve, for Go to Emergency room/ urgent care if worse, at any time for any worsening symptoms.   Jairo Ben, FNP

## 2020-08-02 NOTE — Patient Instructions (Signed)
Health Maintenance, Female Adopting a healthy lifestyle and getting preventive care are important in promoting health and wellness. Ask your health care provider about:  The right schedule for you to have regular tests and exams.  Things you can do on your own to prevent diseases and keep yourself healthy. What should I know about diet, weight, and exercise? Eat a healthy diet  Eat a diet that includes plenty of vegetables, fruits, low-fat dairy products, and lean protein.  Do not eat a lot of foods that are high in solid fats, added sugars, or sodium.   Maintain a healthy weight Body mass index (BMI) is used to identify weight problems. It estimates body fat based on height and weight. Your health care provider can help determine your BMI and help you achieve or maintain a healthy weight. Get regular exercise Get regular exercise. This is one of the most important things you can do for your health. Most adults should:  Exercise for at least 150 minutes each week. The exercise should increase your heart rate and make you sweat (moderate-intensity exercise).  Do strengthening exercises at least twice a week. This is in addition to the moderate-intensity exercise.  Spend less time sitting. Even light physical activity can be beneficial. Watch cholesterol and blood lipids Have your blood tested for lipids and cholesterol at 24 years of age, then have this test every 5 years. Have your cholesterol levels checked more often if:  Your lipid or cholesterol levels are high.  You are older than 24 years of age.  You are at high risk for heart disease. What should I know about cancer screening? Depending on your health history and family history, you may need to have cancer screening at various ages. This may include screening for:  Breast cancer.  Cervical cancer.  Colorectal cancer.  Skin cancer.  Lung cancer. What should I know about heart disease, diabetes, and high blood  pressure? Blood pressure and heart disease  High blood pressure causes heart disease and increases the risk of stroke. This is more likely to develop in people who have high blood pressure readings, are of African descent, or are overweight.  Have your blood pressure checked: ? Every 3-5 years if you are 18-39 years of age. ? Every year if you are 40 years old or older. Diabetes Have regular diabetes screenings. This checks your fasting blood sugar level. Have the screening done:  Once every three years after age 40 if you are at a normal weight and have a low risk for diabetes.  More often and at a younger age if you are overweight or have a high risk for diabetes. What should I know about preventing infection? Hepatitis B If you have a higher risk for hepatitis B, you should be screened for this virus. Talk with your health care provider to find out if you are at risk for hepatitis B infection. Hepatitis C Testing is recommended for:  Everyone born from 1945 through 1965.  Anyone with known risk factors for hepatitis C. Sexually transmitted infections (STIs)  Get screened for STIs, including gonorrhea and chlamydia, if: ? You are sexually active and are younger than 24 years of age. ? You are older than 24 years of age and your health care provider tells you that you are at risk for this type of infection. ? Your sexual activity has changed since you were last screened, and you are at increased risk for chlamydia or gonorrhea. Ask your health care provider   if you are at risk.  Ask your health care provider about whether you are at high risk for HIV. Your health care provider may recommend a prescription medicine to help prevent HIV infection. If you choose to take medicine to prevent HIV, you should first get tested for HIV. You should then be tested every 3 months for as long as you are taking the medicine. Pregnancy  If you are about to stop having your period (premenopausal) and  you may become pregnant, seek counseling before you get pregnant.  Take 400 to 800 micrograms (mcg) of folic acid every day if you become pregnant.  Ask for birth control (contraception) if you want to prevent pregnancy. Osteoporosis and menopause Osteoporosis is a disease in which the bones lose minerals and strength with aging. This can result in bone fractures. If you are 41 years old or older, or if you are at risk for osteoporosis and fractures, ask your health care provider if you should:  Be screened for bone loss.  Take a calcium or vitamin D supplement to lower your risk of fractures.  Be given hormone replacement therapy (HRT) to treat symptoms of menopause. Follow these instructions at home: Lifestyle  Do not use any products that contain nicotine or tobacco, such as cigarettes, e-cigarettes, and chewing tobacco. If you need help quitting, ask your health care provider.  Do not use street drugs.  Do not share needles.  Ask your health care provider for help if you need support or information about quitting drugs. Alcohol use  Do not drink alcohol if: ? Your health care provider tells you not to drink. ? You are pregnant, may be pregnant, or are planning to become pregnant.  If you drink alcohol: ? Limit how much you use to 0-1 drink a day. ? Limit intake if you are breastfeeding.  Be aware of how much alcohol is in your drink. In the [place] one drink equals one 12 oz bottle of beer (355 mL), one 5 oz glass of wine (148 mL), or one 1 oz glass of hard liquor (44 mL). General instructions  Schedule regular health, dental, and eye exams.  Stay current with your vaccines.  Tell your health care provider if: ? You often feel depressed. ? You have ever been abused or do not feel safe at home. Summary  Adopting a healthy lifestyle and getting preventive care are important in promoting health and wellness.  Follow your health care provider's instructions about healthy  diet, exercising, and getting tested or screened for diseases.  Follow your health care provider's instructions on monitoring your cholesterol and blood pressure. This information is not intended to replace advice given to you by your health care provider. Make sure you discuss any questions you have with your health care provider. Document Revised: 02/11/2018 Document Reviewed: 02/11/2018 Elsevier Patient Education  2021 [employer] Inc[employer] Raynaud Phenomenon  Raynaud phenomenon is a condition that affects the blood vessels (arteries) that carry blood to your fingers and toes. The arteries that supply blood to your ears, lips, nipples, or the tip of your nose might also be affected. Raynaud phenomenon causes the arteries to become narrow temporarily (spasm). As a result, the flow of blood to the affected areas is temporarily decreased. This usually occurs in response to cold temperatures or stress. During an attack, the skin in the affected areas turns white, then blue, and finally red. You may also feel tingling or numbness in those areas. Attacks usually last for only a  brief period, and then the blood flow to the area returns to normal. In most cases, Raynaud phenomenon does not cause serious health problems. What are the causes? In many cases, the cause of this condition is not known. The condition may occur on its own (primary Raynaud phenomenon) or may be associated with other diseases or factors (secondary Raynaud phenomenon). Possible causes may include:  Diseases or medical conditions that damage the arteries.  Injuries and repetitive actions that hurt the hands or feet.  Being exposed to certain chemicals.  Taking medicines that narrow the arteries.  Other medical conditions, such as lupus, scleroderma, rheumatoid arthritis, thyroid problems, blood disorders, Sjogren syndrome, or atherosclerosis. What increases the risk? The following factors may make you more likely to develop this  condition:  Being 15-59 years old.  Being female.  Having a family history of Raynaud phenomenon.  Living in a cold climate.  Smoking. What are the signs or symptoms? Symptoms of this condition usually occur when you are exposed to cold temperatures or when you have emotional stress. The symptoms may last for a few minutes or up to several hours. They usually affect your fingers but may also affect your toes, nipples, lips, ears, or the tip of your nose. Symptoms may include:  Changes in skin color. The skin in the affected areas will turn pale or white. The skin may then change from white to bluish to red as normal blood flow returns to the area.  Numbness, tingling, or pain in the affected areas. In severe cases, symptoms may include:  Skin sores.  Tissues decaying and dying (gangrene). How is this diagnosed? This condition may be diagnosed based on:  Your symptoms and medical history.  A physical exam. During the exam, you may be asked to put your hands in cold water to check for a reaction to cold temperature.  Tests, such as: ? Blood tests to check for other diseases or conditions. ? A test to check the movement of blood through your arteries and veins (vascular ultrasound). ? A test in which the skin at the base of your fingernail is examined under a microscope (nailfold capillaroscopy). How is this treated? Treatment for this condition often involves making lifestyle changes and taking steps to control your exposure to cold temperatures. For more severe cases, medicine (calcium channel blockers) may be used to improve blood flow. Surgery is sometimes done to block the nerves that control the affected arteries, but this is rare. Follow these instructions at home: Avoiding cold temperatures Take these steps to avoid exposure to cold:  If possible, stay indoors during cold weather.  When you go outside during cold weather, dress in layers and wear mittens, a hat, a scarf,  and warm footwear.  Wear mittens or gloves when handling ice or frozen food.  Use holders for glasses or cans containing cold drinks.  Let warm water run for a while before taking a shower or bath.  Warm up the car before driving in cold weather. Lifestyle  If possible, avoid stressful and emotional situations. Try to find ways to manage your stress, such as: ? Exercise. ? Yoga. ? Meditation. ? Biofeedback.  Do not use any products that contain nicotine or tobacco, such as cigarettes and e-cigarettes. If you need help quitting, ask your health care provider.  Avoid secondhand smoke.  Limit your use of caffeine. ? Switch to decaffeinated coffee, tea, and soda. ? Avoid chocolate.  Avoid vibrating tools and machinery. General instructions  Protect  your hands and feet from injuries, cuts, or bruises.  Avoid wearing tight rings or wristbands.  Wear loose fitting socks and comfortable, roomy shoes.  Take over-the-counter and prescription medicines only as told by your health care provider. Contact a health care provider if:  Your discomfort becomes worse despite lifestyle changes.  You develop sores on your fingers or toes that do not heal.  Your fingers or toes turn black.  You have breaks in the skin on your fingers or toes.  You have a fever.  You have pain or swelling in your joints.  You have a rash.  Your symptoms occur on only one side of your body. Summary  Raynaud phenomenon is a condition that affects the arteries that carry blood to your fingers, toes, ears, lips, nipples, or the tip of your nose.  In many cases, the cause of this condition is not known.  Symptoms of this condition include changes in skin color, and numbness and tingling of the affected area.  Treatment for this condition includes lifestyle changes, reducing exposure to cold temperatures, and using medicines for severe cases of the condition.  Contact your health care provider if  your condition worsens despite treatment. This information is not intended to replace advice given to you by your health care provider. Make sure you discuss any questions you have with your health care provider. Document Revised: 07/01/2019 Document Reviewed: 07/01/2019 Elsevier Patient Education  2021 [employer].

## 2020-08-03 ENCOUNTER — Other Ambulatory Visit (INDEPENDENT_AMBULATORY_CARE_PROVIDER_SITE_OTHER): Payer: 59

## 2020-08-03 DIAGNOSIS — I7300 Raynaud's syndrome without gangrene: Secondary | ICD-10-CM

## 2020-08-03 DIAGNOSIS — Z1389 Encounter for screening for other disorder: Secondary | ICD-10-CM

## 2020-08-03 DIAGNOSIS — Z13220 Encounter for screening for lipoid disorders: Secondary | ICD-10-CM | POA: Diagnosis not present

## 2020-08-03 DIAGNOSIS — E559 Vitamin D deficiency, unspecified: Secondary | ICD-10-CM | POA: Diagnosis not present

## 2020-08-03 LAB — CBC WITH DIFFERENTIAL/PLATELET
Basophils Absolute: 0 10*3/uL (ref 0.0–0.1)
Basophils Relative: 0.5 % (ref 0.0–3.0)
Eosinophils Absolute: 0.2 10*3/uL (ref 0.0–0.7)
Eosinophils Relative: 2.9 % (ref 0.0–5.0)
HCT: 43.2 % (ref 36.0–46.0)
Hemoglobin: 14.5 g/dL (ref 12.0–15.0)
Lymphocytes Relative: 40.6 % (ref 12.0–46.0)
Lymphs Abs: 2.5 10*3/uL (ref 0.7–4.0)
MCHC: 33.6 g/dL (ref 30.0–36.0)
MCV: 85.4 fl (ref 78.0–100.0)
Monocytes Absolute: 0.6 10*3/uL (ref 0.1–1.0)
Monocytes Relative: 9.7 % (ref 3.0–12.0)
Neutro Abs: 2.9 10*3/uL (ref 1.4–7.7)
Neutrophils Relative %: 46.3 % (ref 43.0–77.0)
Platelets: 201 10*3/uL (ref 150.0–400.0)
RBC: 5.07 Mil/uL (ref 3.87–5.11)
RDW: 13.4 % (ref 11.5–15.5)
WBC: 6.2 10*3/uL (ref 4.0–10.5)

## 2020-08-03 LAB — TSH: TSH: 2.48 u[IU]/mL (ref 0.35–4.50)

## 2020-08-03 LAB — VITAMIN D 25 HYDROXY (VIT D DEFICIENCY, FRACTURES): VITD: 45.57 ng/mL (ref 30.00–100.00)

## 2020-08-03 LAB — SEDIMENTATION RATE: Sed Rate: 1 mm/hr (ref 0–20)

## 2020-08-04 LAB — COMPREHENSIVE METABOLIC PANEL
ALT: 27 U/L (ref 0–35)
AST: 25 U/L (ref 0–37)
Albumin: 4.2 g/dL (ref 3.5–5.2)
Alkaline Phosphatase: 87 U/L (ref 39–117)
BUN: 15 mg/dL (ref 6–23)
CO2: 24 mEq/L (ref 19–32)
Calcium: 8.8 mg/dL (ref 8.4–10.5)
Chloride: 106 mEq/L (ref 96–112)
Creatinine, Ser: 0.78 mg/dL (ref 0.40–1.20)
GFR: 106.87 mL/min (ref >60.00)
Glucose, Bld: 88 mg/dL (ref 70–99)
Potassium: 4.3 mEq/L (ref 3.5–5.1)
Sodium: 140 mEq/L (ref 135–145)
Total Bilirubin: 0.5 mg/dL (ref 0.2–1.2)
Total Protein: 6.4 g/dL (ref 6.0–8.3)

## 2020-08-04 LAB — LIPID PANEL
Cholesterol: 136 mg/dL (ref 0–200)
HDL: 50.5 mg/dL (ref >39.00)
LDL Cholesterol: 73 mg/dL (ref 0–99)
NonHDL: 85.22
Total CHOL/HDL Ratio: 3
Triglycerides: 63 mg/dL (ref 0.0–149.0)
VLDL: 12.6 mg/dL (ref 0.0–40.0)

## 2020-08-04 LAB — C-REACTIVE PROTEIN: CRP: <1 mg/dL (ref 0.5–20.0)

## 2020-08-05 LAB — CULTURE, URINE COMPREHENSIVE
MICRO NUMBER:: 11961371
RESULT:: NO GROWTH
SPECIMEN QUALITY:: ADEQUATE

## 2020-08-07 LAB — ANA,IFA RA DIAG PNL W/RFLX TIT/PATN
Anti Nuclear Antibody (ANA): POSITIVE — AB
Cyclic Citrullin Peptide Ab: <16 UNITS
Rheumatoid fact SerPl-aCnc: <14 IU/mL (ref <14)

## 2020-08-07 LAB — B. BURGDORFI ANTIBODIES: B burgdorferi Ab IgG+IgM: <0.9 index

## 2020-08-07 LAB — ANTI-NUCLEAR AB-TITER (ANA TITER): ANA Titer 1: 1:80 {titer} — ABNORMAL HIGH

## 2020-08-07 NOTE — Progress Notes (Signed)
Urine culture no growth Lyme antibodies are negative.  ANA is positive, and has speckled pattern would advised referral to rheumatology for further work up / evaluation. CRP within normal limits.  CMP is within normal limits.  Vitamin D is within normal.  TSH for thyroid within normal limits.  Sed rate is within normal limits. CBC is within normal limits.

## 2020-08-08 ENCOUNTER — Other Ambulatory Visit: Payer: Self-pay | Admitting: Adult Health

## 2020-08-08 DIAGNOSIS — R768 Other specified abnormal immunological findings in serum: Secondary | ICD-10-CM | POA: Insufficient documentation

## 2020-08-08 NOTE — Progress Notes (Signed)
Orders Placed This Encounter  Procedures  . Ambulatory referral to Rheumatology    ANA positive - Plan: Ambulatory referral to Rheumatology

## 2020-08-08 NOTE — Progress Notes (Signed)
Orders Placed This Encounter Procedures  Ambulatory referral to Rheumatology She should hear within 2 weeks for referral.

## 2020-08-15 DIAGNOSIS — J3089 Other allergic rhinitis: Secondary | ICD-10-CM | POA: Diagnosis not present

## 2020-08-15 DIAGNOSIS — J301 Allergic rhinitis due to pollen: Secondary | ICD-10-CM | POA: Diagnosis not present

## 2020-08-15 DIAGNOSIS — J3081 Allergic rhinitis due to animal (cat) (dog) hair and dander: Secondary | ICD-10-CM | POA: Diagnosis not present

## 2020-08-18 ENCOUNTER — Other Ambulatory Visit: Payer: Self-pay

## 2020-08-18 DIAGNOSIS — J029 Acute pharyngitis, unspecified: Secondary | ICD-10-CM | POA: Diagnosis not present

## 2020-08-18 DIAGNOSIS — J069 Acute upper respiratory infection, unspecified: Secondary | ICD-10-CM | POA: Diagnosis not present

## 2020-08-18 MED ORDER — PSEUDOEPH-BROMPHEN-DM 30-2-10 MG/5ML PO SYRP
ORAL_SOLUTION | ORAL | 0 refills | Status: DC
  Filled 2020-08-18: qty 118, 6d supply, fill #0

## 2020-08-18 MED ORDER — AMOXICILLIN-POT CLAVULANATE 875-125 MG PO TABS
ORAL_TABLET | ORAL | 0 refills | Status: DC
  Filled 2020-08-18: qty 20, 10d supply, fill #0

## 2020-08-24 ENCOUNTER — Encounter: Payer: BC Managed Care – PPO | Admitting: Certified Nurse Midwife

## 2020-08-24 ENCOUNTER — Encounter: Payer: Self-pay | Admitting: Oncology

## 2020-08-31 ENCOUNTER — Ambulatory Visit (INDEPENDENT_AMBULATORY_CARE_PROVIDER_SITE_OTHER): Payer: 59 | Admitting: Certified Nurse Midwife

## 2020-08-31 ENCOUNTER — Other Ambulatory Visit: Payer: Self-pay

## 2020-08-31 ENCOUNTER — Encounter: Payer: Self-pay | Admitting: Certified Nurse Midwife

## 2020-08-31 VITALS — BP 105/68 | HR 73 | Resp 16 | Ht 67.0 in | Wt 126.4 lb

## 2020-08-31 DIAGNOSIS — Z975 Presence of (intrauterine) contraceptive device: Secondary | ICD-10-CM

## 2020-08-31 DIAGNOSIS — Z01419 Encounter for gynecological examination (general) (routine) without abnormal findings: Secondary | ICD-10-CM | POA: Diagnosis not present

## 2020-08-31 NOTE — Progress Notes (Signed)
ANNUAL PREVENTATIVE CARE GYN  ENCOUNTER NOTE  Subjective:       Cindy Hart is a 25 y.o. G0P0000 female here for a routine annual gynecologic exam.  Current complaints: 1.  Intermittent right sided cramping since IUD placement  Denies difficulty breathing or respiratory distress, chest pain, abdominal pain, excessive vaginal bleeding, dysuria, and leg pain or swelling.    Gynecologic History  No LMP recorded. (Menstrual status: IUD).  Contraception: IUD, Mirena inserted 10/2019  Last Pap: 08/2018. Results were: normal  Obstetric History  OB History  Gravida Para Term Preterm AB Living  0 0 0 0 0 0  SAB IAB Ectopic Multiple Live Births  0 0 0 0 0    Past Medical History:  Diagnosis Date   Allergies    Anemia 2018   infusions - improved   Exercise-induced asthma    none recently   HSV-1 (herpes simplex virus 1) infection    Migraine headache    3-4x/wk   Motion sickness back seat cars   Wears contact lenses     Past Surgical History:  Procedure Laterality Date   TONSILLECTOMY N/A 05/08/2017   Procedure: TONSILLECTOMY;  Surgeon: Linus Salmons, MD;  Location: [facility];  Service: ENT;  Laterality: N/A;   WISDOM TOOTH EXTRACTION      Current Outpatient Medications on File Prior to Visit  Medication Sig Dispense Refill   albuterol (VENTOLIN HFA) 108 (90 Base) MCG/ACT inhaler Inhale 2 puffs into the lungs every 4 (four) hours as needed.     EPINEPHrine 0.3 mg/0.3 mL IJ SOAJ injection INJECT INTO THE MUSCLE AS DIRECTED 2 each 1   famotidine (PEPCID) 20 MG tablet Take 20 mg by mouth 2 (two) times daily.     fexofenadine (ALLEGRA ALLERGY) 180 MG tablet Take 2 tablets daily for 2 weeks. Then decrease to 1 tablet daily. 60 tablet 2   levocetirizine (XYZAL) 5 MG tablet SMARTSIG:1 Tablet(s) By Mouth Every Evening     No current facility-administered medications on file prior to visit.    Allergies  Allergen Reactions   Feraheme [Ferumoxytol] Anaphylaxis     Social History   Socioeconomic History   Marital status: Single    Spouse name: Not on file   Number of children: Not on file   Years of education: Not on file   Highest education level: Not on file  Occupational History   Not on file  Tobacco Use   Smoking status: Never   Smokeless tobacco: Never  Vaping Use   Vaping Use: Never used  Substance and Sexual Activity   Alcohol use: Yes    Alcohol/week: 0.0 standard drinks    Comment: socially    Drug use: No   Sexual activity: Yes    Birth control/protection: I.U.D.  Other Topics Concern   Not on file  Social History Narrative   Not on file   Social Determinants of Health   Financial Resource Strain: Not on file  Food Insecurity: Not on file  Transportation Needs: Not on file  Physical Activity: Not on file  Stress: Not on file  Social Connections: Not on file  Intimate Partner Violence: Not on file    Family History  Problem Relation Age of Onset   Thyroid disease Mother    Diabetes Maternal Grandmother    Heart disease Maternal Grandmother    Hypertension Maternal Grandfather    Atrial fibrillation Paternal Grandmother     The following portions of the patient's history were  reviewed and updated as appropriate: allergies, current medications, past family history, past medical history, past social history, past surgical history and problem list.  Review of Systems ROS negative except as noted above. Information obtained from patient.    Objective:   BP 105/68   Pulse 73   Resp 16   Ht 5\' 7"  (1.702 m)   Wt 126 lb 6.4 oz (57.3 kg)   BMI 19.80 kg/m   CONSTITUTIONAL: Well-developed, well-nourished female in no acute distress.   PSYCHIATRIC: Normal mood and affect. Normal behavior. Normal judgment and thought content.  NEUROLGIC: Alert and oriented to person, place, and time. Normal muscle tone coordination. No cranial nerve deficit noted.  HENT:  Normocephalic, atraumatic.  EYES: Conjunctivae and  EOM are normal.   NECK: Normal range of motion, supple, no masses.  Normal thyroid.   SKIN: Skin is warm and dry. No rash noted. Not diaphoretic. No erythema. No pallor.  CARDIOVASCULAR: Normal heart rate noted, regular rhythm, no murmur.  RESPIRATORY: Clear to auscultation bilaterally. Effort and breath sounds normal, no problems with respiration noted.  BREASTS: Symmetric in size. No masses, skin changes, nipple drainage, or lymphadenopathy.  ABDOMEN: Soft, normal bowel sounds, no distention noted.  No tenderness, rebound or guarding.   PELVIC:  External Genitalia: Normal  Vagina: Normal  Cervix: Normal, IUD strings present  MUSCULOSKELETAL: Normal range of motion. No tenderness.  No cyanosis, clubbing, or edema.  2+ distal pulses.  LYMPHATIC: No Axillary, Supraclavicular, or Inguinal Adenopathy.    Assessment:   Annual gynecologic examination 25 y.o.  Contraception: IUD, Mirena  Normal BMI  Problem List Items Addressed This Visit       Other   IUD (intrauterine device) in place   Other Visit Diagnoses     Well woman exam    -  Primary       Plan:   Pap: Not needed  Labs:  Declines  Routine preventative health maintenance measures emphasized: Exercise/Diet/Weight control, Tobacco Warnings, Alcohol/Substance use risks, and Stress Management; see AVS  If cramping continues, patient agrees to contact office to schedule ultraound  Reviewed red flag symptoms and when to call  Return to Clinic - 1 Year for 30 or sooner if needed   [employer], CNM  [facility] 08/31/20 4:30 PM

## 2020-08-31 NOTE — Patient Instructions (Signed)
Preventive Care 21-24 Years Old, Female Preventive care refers to lifestyle choices and visits with your health care provider that can promote health and wellness. This includes: A yearly physical exam. This is also called an annual wellness visit. Regular dental and eye exams. Immunizations. Screening for certain conditions. Healthy lifestyle choices, such as: Eating a healthy diet. Getting regular exercise. Not using drugs or products that contain nicotine and tobacco. Limiting alcohol use. What can I expect for my preventive care visit? Physical exam Your health care provider may check your: Height and weight. These may be used to calculate your BMI (body mass index). BMI is a measurement that tells if you are at a healthy weight. Heart rate and blood pressure. Body temperature. Skin for abnormal spots. Counseling Your health care provider may ask you questions about your: Past medical problems. Family's medical history. Alcohol, tobacco, and drug use. Emotional well-being. Home life and relationship well-being. Sexual activity. Diet, exercise, and sleep habits. Work and work environment. Access to firearms. Method of birth control. Menstrual cycle. Pregnancy history. What immunizations do I need?  Vaccines are usually given at various ages, according to a schedule. Your health care provider will recommend vaccines for you based on your age, medicalhistory, and lifestyle or other factors, such as travel or where you work. What tests do I need?  Blood tests Lipid and cholesterol levels. These may be checked every 5 years starting at age 20. Hepatitis C test. Hepatitis B test. Screening Diabetes screening. This is done by checking your blood sugar (glucose) after you have not eaten for a while (fasting). STD (sexually transmitted disease) testing, if you are at risk. BRCA-related cancer screening. This may be done if you have a family history of breast, ovarian, tubal, or  peritoneal cancers. Pelvic exam and Pap test. This may be done every 3 years starting at age 21. Starting at age 30, this may be done every 5 years if you have a Pap test in combination with an HPV test. Talk with your health care provider about your test results, treatment options,and if necessary, the need for more tests. Follow these instructions at home: Eating and drinking  Eat a healthy diet that includes fresh fruits and vegetables, whole grains, lean protein, and low-fat dairy products. Take vitamin and mineral supplements as recommended by your health care provider. Do not drink alcohol if: Your health care provider tells you not to drink. You are pregnant, may be pregnant, or are planning to become pregnant. If you drink alcohol: Limit how much you have to 0-1 drink a day. Be aware of how much alcohol is in your drink. In the [place] one drink equals one 12 oz bottle of beer (355 mL), one 5 oz glass of wine (148 mL), or one 1 oz glass of hard liquor (44 mL).  Lifestyle Take daily care of your teeth and gums. Brush your teeth every morning and night with fluoride toothpaste. Floss one time each day. Stay active. Exercise for at least 30 minutes 5 or more days each week. Do not use any products that contain nicotine or tobacco, such as cigarettes, e-cigarettes, and chewing tobacco. If you need help quitting, ask your health care provider. Do not use drugs. If you are sexually active, practice safe sex. Use a condom or other form of protection to prevent STIs (sexually transmitted infections). If you do not wish to become pregnant, use a form of birth control. If you plan to become pregnant, see your health care   provider for a prepregnancy visit. Find healthy ways to cope with stress, such as: Meditation, yoga, or listening to music. Journaling. Talking to a trusted person. Spending time with friends and family. Safety Always wear your seat belt while driving or riding in a  vehicle. Do not drive: If you have been drinking alcohol. Do not ride with someone who has been drinking. When you are tired or distracted. While texting. Wear a helmet and other protective equipment during sports activities. If you have firearms in your house, make sure you follow all gun safety procedures. Seek help if you have been physically or sexually abused. What's next? Go to your health care provider once a year for an annual wellness visit. Ask your health care provider how often you should have your eyes and teeth checked. Stay up to date on all vaccines. This information is not intended to replace advice given to you by your health care provider. Make sure you discuss any questions you have with your healthcare provider. Document Revised: 10/17/2019 Document Reviewed: 10/30/2017 Elsevier Patient Education  2022 [employer].

## 2020-09-01 DIAGNOSIS — J301 Allergic rhinitis due to pollen: Secondary | ICD-10-CM | POA: Diagnosis not present

## 2020-09-01 DIAGNOSIS — J3089 Other allergic rhinitis: Secondary | ICD-10-CM | POA: Diagnosis not present

## 2020-09-01 DIAGNOSIS — J3081 Allergic rhinitis due to animal (cat) (dog) hair and dander: Secondary | ICD-10-CM | POA: Diagnosis not present

## 2020-09-05 DIAGNOSIS — J3081 Allergic rhinitis due to animal (cat) (dog) hair and dander: Secondary | ICD-10-CM | POA: Diagnosis not present

## 2020-09-05 DIAGNOSIS — J301 Allergic rhinitis due to pollen: Secondary | ICD-10-CM | POA: Diagnosis not present

## 2020-09-05 DIAGNOSIS — J3089 Other allergic rhinitis: Secondary | ICD-10-CM | POA: Diagnosis not present

## 2020-09-07 DIAGNOSIS — J3081 Allergic rhinitis due to animal (cat) (dog) hair and dander: Secondary | ICD-10-CM | POA: Diagnosis not present

## 2020-09-07 DIAGNOSIS — J301 Allergic rhinitis due to pollen: Secondary | ICD-10-CM | POA: Diagnosis not present

## 2020-09-07 DIAGNOSIS — J3089 Other allergic rhinitis: Secondary | ICD-10-CM | POA: Diagnosis not present

## 2020-09-19 DIAGNOSIS — J301 Allergic rhinitis due to pollen: Secondary | ICD-10-CM | POA: Diagnosis not present

## 2020-09-19 DIAGNOSIS — J3081 Allergic rhinitis due to animal (cat) (dog) hair and dander: Secondary | ICD-10-CM | POA: Diagnosis not present

## 2020-09-19 DIAGNOSIS — J3089 Other allergic rhinitis: Secondary | ICD-10-CM | POA: Diagnosis not present

## 2020-09-20 DIAGNOSIS — I7300 Raynaud's syndrome without gangrene: Secondary | ICD-10-CM | POA: Diagnosis not present

## 2020-09-20 DIAGNOSIS — D8989 Other specified disorders involving the immune mechanism, not elsewhere classified: Secondary | ICD-10-CM | POA: Diagnosis not present

## 2020-09-20 DIAGNOSIS — R768 Other specified abnormal immunological findings in serum: Secondary | ICD-10-CM | POA: Diagnosis not present

## 2020-09-22 DIAGNOSIS — J3089 Other allergic rhinitis: Secondary | ICD-10-CM | POA: Diagnosis not present

## 2020-09-22 DIAGNOSIS — J3081 Allergic rhinitis due to animal (cat) (dog) hair and dander: Secondary | ICD-10-CM | POA: Diagnosis not present

## 2020-09-22 DIAGNOSIS — J301 Allergic rhinitis due to pollen: Secondary | ICD-10-CM | POA: Diagnosis not present

## 2020-09-26 DIAGNOSIS — J3081 Allergic rhinitis due to animal (cat) (dog) hair and dander: Secondary | ICD-10-CM | POA: Diagnosis not present

## 2020-09-26 DIAGNOSIS — J301 Allergic rhinitis due to pollen: Secondary | ICD-10-CM | POA: Diagnosis not present

## 2020-09-26 DIAGNOSIS — J3089 Other allergic rhinitis: Secondary | ICD-10-CM | POA: Diagnosis not present

## 2020-10-05 DIAGNOSIS — J3089 Other allergic rhinitis: Secondary | ICD-10-CM | POA: Diagnosis not present

## 2020-10-05 DIAGNOSIS — J301 Allergic rhinitis due to pollen: Secondary | ICD-10-CM | POA: Diagnosis not present

## 2020-10-05 DIAGNOSIS — J3081 Allergic rhinitis due to animal (cat) (dog) hair and dander: Secondary | ICD-10-CM | POA: Diagnosis not present

## 2020-10-19 DIAGNOSIS — J3081 Allergic rhinitis due to animal (cat) (dog) hair and dander: Secondary | ICD-10-CM | POA: Diagnosis not present

## 2020-10-19 DIAGNOSIS — J301 Allergic rhinitis due to pollen: Secondary | ICD-10-CM | POA: Diagnosis not present

## 2020-10-19 DIAGNOSIS — J3089 Other allergic rhinitis: Secondary | ICD-10-CM | POA: Diagnosis not present

## 2020-10-27 DIAGNOSIS — J301 Allergic rhinitis due to pollen: Secondary | ICD-10-CM | POA: Diagnosis not present

## 2020-10-27 DIAGNOSIS — J3081 Allergic rhinitis due to animal (cat) (dog) hair and dander: Secondary | ICD-10-CM | POA: Diagnosis not present

## 2020-10-27 DIAGNOSIS — J3089 Other allergic rhinitis: Secondary | ICD-10-CM | POA: Diagnosis not present

## 2020-10-31 DIAGNOSIS — J3089 Other allergic rhinitis: Secondary | ICD-10-CM | POA: Diagnosis not present

## 2020-10-31 DIAGNOSIS — J301 Allergic rhinitis due to pollen: Secondary | ICD-10-CM | POA: Diagnosis not present

## 2020-10-31 DIAGNOSIS — J3081 Allergic rhinitis due to animal (cat) (dog) hair and dander: Secondary | ICD-10-CM | POA: Diagnosis not present

## 2020-11-01 DIAGNOSIS — J3089 Other allergic rhinitis: Secondary | ICD-10-CM | POA: Diagnosis not present

## 2020-11-10 DIAGNOSIS — J3081 Allergic rhinitis due to animal (cat) (dog) hair and dander: Secondary | ICD-10-CM | POA: Diagnosis not present

## 2020-11-10 DIAGNOSIS — J301 Allergic rhinitis due to pollen: Secondary | ICD-10-CM | POA: Diagnosis not present

## 2020-11-10 DIAGNOSIS — J3089 Other allergic rhinitis: Secondary | ICD-10-CM | POA: Diagnosis not present

## 2020-11-14 DIAGNOSIS — J3089 Other allergic rhinitis: Secondary | ICD-10-CM | POA: Diagnosis not present

## 2020-11-14 DIAGNOSIS — J3081 Allergic rhinitis due to animal (cat) (dog) hair and dander: Secondary | ICD-10-CM | POA: Diagnosis not present

## 2020-11-14 DIAGNOSIS — J301 Allergic rhinitis due to pollen: Secondary | ICD-10-CM | POA: Diagnosis not present

## 2020-11-16 DIAGNOSIS — J3089 Other allergic rhinitis: Secondary | ICD-10-CM | POA: Diagnosis not present

## 2020-11-16 DIAGNOSIS — J301 Allergic rhinitis due to pollen: Secondary | ICD-10-CM | POA: Diagnosis not present

## 2020-11-16 DIAGNOSIS — J3081 Allergic rhinitis due to animal (cat) (dog) hair and dander: Secondary | ICD-10-CM | POA: Diagnosis not present

## 2020-11-21 DIAGNOSIS — J3089 Other allergic rhinitis: Secondary | ICD-10-CM | POA: Diagnosis not present

## 2020-11-21 DIAGNOSIS — J3081 Allergic rhinitis due to animal (cat) (dog) hair and dander: Secondary | ICD-10-CM | POA: Diagnosis not present

## 2020-11-21 DIAGNOSIS — J301 Allergic rhinitis due to pollen: Secondary | ICD-10-CM | POA: Diagnosis not present

## 2020-11-23 DIAGNOSIS — J3081 Allergic rhinitis due to animal (cat) (dog) hair and dander: Secondary | ICD-10-CM | POA: Diagnosis not present

## 2020-11-23 DIAGNOSIS — J3089 Other allergic rhinitis: Secondary | ICD-10-CM | POA: Diagnosis not present

## 2020-11-23 DIAGNOSIS — J301 Allergic rhinitis due to pollen: Secondary | ICD-10-CM | POA: Diagnosis not present

## 2020-11-30 DIAGNOSIS — J3081 Allergic rhinitis due to animal (cat) (dog) hair and dander: Secondary | ICD-10-CM | POA: Diagnosis not present

## 2020-11-30 DIAGNOSIS — J301 Allergic rhinitis due to pollen: Secondary | ICD-10-CM | POA: Diagnosis not present

## 2020-11-30 DIAGNOSIS — J3089 Other allergic rhinitis: Secondary | ICD-10-CM | POA: Diagnosis not present

## 2020-12-08 DIAGNOSIS — J3089 Other allergic rhinitis: Secondary | ICD-10-CM | POA: Diagnosis not present

## 2020-12-08 DIAGNOSIS — J3081 Allergic rhinitis due to animal (cat) (dog) hair and dander: Secondary | ICD-10-CM | POA: Diagnosis not present

## 2020-12-08 DIAGNOSIS — J301 Allergic rhinitis due to pollen: Secondary | ICD-10-CM | POA: Diagnosis not present

## 2020-12-14 DIAGNOSIS — J3081 Allergic rhinitis due to animal (cat) (dog) hair and dander: Secondary | ICD-10-CM | POA: Diagnosis not present

## 2020-12-14 DIAGNOSIS — J3089 Other allergic rhinitis: Secondary | ICD-10-CM | POA: Diagnosis not present

## 2020-12-14 DIAGNOSIS — J301 Allergic rhinitis due to pollen: Secondary | ICD-10-CM | POA: Diagnosis not present

## 2020-12-21 DIAGNOSIS — J3089 Other allergic rhinitis: Secondary | ICD-10-CM | POA: Diagnosis not present

## 2020-12-21 DIAGNOSIS — J3081 Allergic rhinitis due to animal (cat) (dog) hair and dander: Secondary | ICD-10-CM | POA: Diagnosis not present

## 2020-12-21 DIAGNOSIS — J301 Allergic rhinitis due to pollen: Secondary | ICD-10-CM | POA: Diagnosis not present

## 2020-12-26 DIAGNOSIS — J3081 Allergic rhinitis due to animal (cat) (dog) hair and dander: Secondary | ICD-10-CM | POA: Diagnosis not present

## 2020-12-26 DIAGNOSIS — J301 Allergic rhinitis due to pollen: Secondary | ICD-10-CM | POA: Diagnosis not present

## 2020-12-26 DIAGNOSIS — J3089 Other allergic rhinitis: Secondary | ICD-10-CM | POA: Diagnosis not present

## 2021-01-02 DIAGNOSIS — J3081 Allergic rhinitis due to animal (cat) (dog) hair and dander: Secondary | ICD-10-CM | POA: Diagnosis not present

## 2021-01-02 DIAGNOSIS — J3089 Other allergic rhinitis: Secondary | ICD-10-CM | POA: Diagnosis not present

## 2021-01-02 DIAGNOSIS — J301 Allergic rhinitis due to pollen: Secondary | ICD-10-CM | POA: Diagnosis not present

## 2021-01-12 DIAGNOSIS — J301 Allergic rhinitis due to pollen: Secondary | ICD-10-CM | POA: Diagnosis not present

## 2021-01-12 DIAGNOSIS — J3081 Allergic rhinitis due to animal (cat) (dog) hair and dander: Secondary | ICD-10-CM | POA: Diagnosis not present

## 2021-01-12 DIAGNOSIS — J3089 Other allergic rhinitis: Secondary | ICD-10-CM | POA: Diagnosis not present

## 2021-01-16 DIAGNOSIS — J3089 Other allergic rhinitis: Secondary | ICD-10-CM | POA: Diagnosis not present

## 2021-01-16 DIAGNOSIS — J3081 Allergic rhinitis due to animal (cat) (dog) hair and dander: Secondary | ICD-10-CM | POA: Diagnosis not present

## 2021-01-16 DIAGNOSIS — J301 Allergic rhinitis due to pollen: Secondary | ICD-10-CM | POA: Diagnosis not present

## 2021-01-23 DIAGNOSIS — J301 Allergic rhinitis due to pollen: Secondary | ICD-10-CM | POA: Diagnosis not present

## 2021-01-23 DIAGNOSIS — J3081 Allergic rhinitis due to animal (cat) (dog) hair and dander: Secondary | ICD-10-CM | POA: Diagnosis not present

## 2021-01-23 DIAGNOSIS — J3089 Other allergic rhinitis: Secondary | ICD-10-CM | POA: Diagnosis not present

## 2021-02-13 DIAGNOSIS — J3081 Allergic rhinitis due to animal (cat) (dog) hair and dander: Secondary | ICD-10-CM | POA: Diagnosis not present

## 2021-02-13 DIAGNOSIS — J3089 Other allergic rhinitis: Secondary | ICD-10-CM | POA: Diagnosis not present

## 2021-02-13 DIAGNOSIS — J301 Allergic rhinitis due to pollen: Secondary | ICD-10-CM | POA: Diagnosis not present

## 2021-02-22 DIAGNOSIS — J3081 Allergic rhinitis due to animal (cat) (dog) hair and dander: Secondary | ICD-10-CM | POA: Diagnosis not present

## 2021-02-22 DIAGNOSIS — J301 Allergic rhinitis due to pollen: Secondary | ICD-10-CM | POA: Diagnosis not present

## 2021-02-22 DIAGNOSIS — J3089 Other allergic rhinitis: Secondary | ICD-10-CM | POA: Diagnosis not present

## 2021-03-06 DIAGNOSIS — J3081 Allergic rhinitis due to animal (cat) (dog) hair and dander: Secondary | ICD-10-CM | POA: Diagnosis not present

## 2021-03-06 DIAGNOSIS — J3089 Other allergic rhinitis: Secondary | ICD-10-CM | POA: Diagnosis not present

## 2021-03-06 DIAGNOSIS — J301 Allergic rhinitis due to pollen: Secondary | ICD-10-CM | POA: Diagnosis not present

## 2021-03-12 NOTE — Progress Notes (Deleted)
Virtual Visit via Video Note  I connected with Cindy Hart on 03/12/21 at  4:30 PM EST by a video enabled telemedicine application and verified that I am speaking with the correct person using two identifiers.  Location: Patient: *** Provider: ***   I discussed the limitations of evaluation and management by telemedicine and the availability of in person appointments. The patient expressed understanding and agreed to proceed.  History of Present Illness:    Observations/Objective:   Assessment and Plan:   Follow Up Instructions:    I discussed the assessment and treatment plan with the patient. The patient was provided an opportunity to ask questions and all were answered. The patient agreed with the plan and demonstrated an understanding of the instructions.   The patient was advised to call back or seek an in-person evaluation if the symptoms worsen or if the condition fails to improve as anticipated.  I provided *** minutes of non-face-to-face time during this encounter.   Jairo Ben, FNP

## 2021-03-13 ENCOUNTER — Telehealth: Payer: 59 | Admitting: Adult Health

## 2021-03-14 ENCOUNTER — Ambulatory Visit: Payer: 59 | Admitting: Adult Health

## 2021-03-14 ENCOUNTER — Other Ambulatory Visit: Payer: Self-pay

## 2021-03-14 DIAGNOSIS — M222X1 Patellofemoral disorders, right knee: Secondary | ICD-10-CM | POA: Diagnosis not present

## 2021-03-14 DIAGNOSIS — M222X2 Patellofemoral disorders, left knee: Secondary | ICD-10-CM | POA: Diagnosis not present

## 2021-03-14 MED ORDER — MELOXICAM 7.5 MG PO TABS
ORAL_TABLET | ORAL | 3 refills | Status: AC
  Filled 2021-03-14: qty 30, 30d supply, fill #0

## 2021-03-23 DIAGNOSIS — J3089 Other allergic rhinitis: Secondary | ICD-10-CM | POA: Diagnosis not present

## 2021-03-23 DIAGNOSIS — J3081 Allergic rhinitis due to animal (cat) (dog) hair and dander: Secondary | ICD-10-CM | POA: Diagnosis not present

## 2021-03-23 DIAGNOSIS — J301 Allergic rhinitis due to pollen: Secondary | ICD-10-CM | POA: Diagnosis not present

## 2021-04-03 DIAGNOSIS — J3081 Allergic rhinitis due to animal (cat) (dog) hair and dander: Secondary | ICD-10-CM | POA: Diagnosis not present

## 2021-04-03 DIAGNOSIS — M25561 Pain in right knee: Secondary | ICD-10-CM | POA: Diagnosis not present

## 2021-04-03 DIAGNOSIS — J301 Allergic rhinitis due to pollen: Secondary | ICD-10-CM | POA: Diagnosis not present

## 2021-04-03 DIAGNOSIS — M25562 Pain in left knee: Secondary | ICD-10-CM | POA: Diagnosis not present

## 2021-04-04 DIAGNOSIS — J3089 Other allergic rhinitis: Secondary | ICD-10-CM | POA: Diagnosis not present

## 2021-04-05 DIAGNOSIS — J301 Allergic rhinitis due to pollen: Secondary | ICD-10-CM | POA: Diagnosis not present

## 2021-04-05 DIAGNOSIS — J3081 Allergic rhinitis due to animal (cat) (dog) hair and dander: Secondary | ICD-10-CM | POA: Diagnosis not present

## 2021-04-05 DIAGNOSIS — J3089 Other allergic rhinitis: Secondary | ICD-10-CM | POA: Diagnosis not present

## 2021-04-07 ENCOUNTER — Telehealth: Payer: 59 | Admitting: Nurse Practitioner

## 2021-04-07 DIAGNOSIS — J00 Acute nasopharyngitis [common cold]: Secondary | ICD-10-CM

## 2021-04-07 MED ORDER — FLUTICASONE PROPIONATE 50 MCG/ACT NA SUSP: 2.0000 | Freq: Every day | NASAL | 6 refills | Status: AC

## 2021-04-07 NOTE — Progress Notes (Signed)

## 2021-04-13 DIAGNOSIS — J301 Allergic rhinitis due to pollen: Secondary | ICD-10-CM | POA: Diagnosis not present

## 2021-04-13 DIAGNOSIS — J3089 Other allergic rhinitis: Secondary | ICD-10-CM | POA: Diagnosis not present

## 2021-04-13 DIAGNOSIS — J3081 Allergic rhinitis due to animal (cat) (dog) hair and dander: Secondary | ICD-10-CM | POA: Diagnosis not present

## 2021-04-26 DIAGNOSIS — J3089 Other allergic rhinitis: Secondary | ICD-10-CM | POA: Diagnosis not present

## 2021-04-26 DIAGNOSIS — J301 Allergic rhinitis due to pollen: Secondary | ICD-10-CM | POA: Diagnosis not present

## 2021-04-26 DIAGNOSIS — J3081 Allergic rhinitis due to animal (cat) (dog) hair and dander: Secondary | ICD-10-CM | POA: Diagnosis not present

## 2021-05-01 DIAGNOSIS — J3089 Other allergic rhinitis: Secondary | ICD-10-CM | POA: Diagnosis not present

## 2021-05-01 DIAGNOSIS — J3081 Allergic rhinitis due to animal (cat) (dog) hair and dander: Secondary | ICD-10-CM | POA: Diagnosis not present

## 2021-05-01 DIAGNOSIS — J301 Allergic rhinitis due to pollen: Secondary | ICD-10-CM | POA: Diagnosis not present

## 2021-05-11 DIAGNOSIS — J3089 Other allergic rhinitis: Secondary | ICD-10-CM | POA: Diagnosis not present

## 2021-05-11 DIAGNOSIS — J301 Allergic rhinitis due to pollen: Secondary | ICD-10-CM | POA: Diagnosis not present

## 2021-05-11 DIAGNOSIS — J3081 Allergic rhinitis due to animal (cat) (dog) hair and dander: Secondary | ICD-10-CM | POA: Diagnosis not present

## 2021-05-29 DIAGNOSIS — J3081 Allergic rhinitis due to animal (cat) (dog) hair and dander: Secondary | ICD-10-CM | POA: Diagnosis not present

## 2021-05-29 DIAGNOSIS — J301 Allergic rhinitis due to pollen: Secondary | ICD-10-CM | POA: Diagnosis not present

## 2021-05-29 DIAGNOSIS — J3089 Other allergic rhinitis: Secondary | ICD-10-CM | POA: Diagnosis not present

## 2021-05-31 DIAGNOSIS — J301 Allergic rhinitis due to pollen: Secondary | ICD-10-CM | POA: Diagnosis not present

## 2021-05-31 DIAGNOSIS — J3081 Allergic rhinitis due to animal (cat) (dog) hair and dander: Secondary | ICD-10-CM | POA: Diagnosis not present

## 2021-05-31 DIAGNOSIS — J3089 Other allergic rhinitis: Secondary | ICD-10-CM | POA: Diagnosis not present

## 2021-06-07 DIAGNOSIS — J301 Allergic rhinitis due to pollen: Secondary | ICD-10-CM | POA: Diagnosis not present

## 2021-06-07 DIAGNOSIS — J3089 Other allergic rhinitis: Secondary | ICD-10-CM | POA: Diagnosis not present

## 2021-06-07 DIAGNOSIS — J3081 Allergic rhinitis due to animal (cat) (dog) hair and dander: Secondary | ICD-10-CM | POA: Diagnosis not present

## 2021-06-19 DIAGNOSIS — J3081 Allergic rhinitis due to animal (cat) (dog) hair and dander: Secondary | ICD-10-CM | POA: Diagnosis not present

## 2021-06-19 DIAGNOSIS — J301 Allergic rhinitis due to pollen: Secondary | ICD-10-CM | POA: Diagnosis not present

## 2021-06-19 DIAGNOSIS — J3089 Other allergic rhinitis: Secondary | ICD-10-CM | POA: Diagnosis not present

## 2021-07-03 DIAGNOSIS — J3089 Other allergic rhinitis: Secondary | ICD-10-CM | POA: Diagnosis not present

## 2021-07-03 DIAGNOSIS — J301 Allergic rhinitis due to pollen: Secondary | ICD-10-CM | POA: Diagnosis not present

## 2021-07-03 DIAGNOSIS — J3081 Allergic rhinitis due to animal (cat) (dog) hair and dander: Secondary | ICD-10-CM | POA: Diagnosis not present

## 2021-07-10 DIAGNOSIS — J3081 Allergic rhinitis due to animal (cat) (dog) hair and dander: Secondary | ICD-10-CM | POA: Diagnosis not present

## 2021-07-10 DIAGNOSIS — J301 Allergic rhinitis due to pollen: Secondary | ICD-10-CM | POA: Diagnosis not present

## 2021-07-10 DIAGNOSIS — J3089 Other allergic rhinitis: Secondary | ICD-10-CM | POA: Diagnosis not present

## 2021-07-13 ENCOUNTER — Other Ambulatory Visit: Payer: Self-pay

## 2021-07-13 DIAGNOSIS — J301 Allergic rhinitis due to pollen: Secondary | ICD-10-CM | POA: Diagnosis not present

## 2021-07-13 DIAGNOSIS — J3081 Allergic rhinitis due to animal (cat) (dog) hair and dander: Secondary | ICD-10-CM | POA: Diagnosis not present

## 2021-07-13 DIAGNOSIS — L298 Other pruritus: Secondary | ICD-10-CM | POA: Diagnosis not present

## 2021-07-13 DIAGNOSIS — J45990 Exercise induced bronchospasm: Secondary | ICD-10-CM | POA: Diagnosis not present

## 2021-07-13 DIAGNOSIS — J3089 Other allergic rhinitis: Secondary | ICD-10-CM | POA: Diagnosis not present

## 2021-07-13 MED ORDER — EPINEPHRINE 0.3 MG/0.3ML IJ SOAJ
INTRAMUSCULAR | 1 refills | Status: AC
  Filled 2021-07-13: qty 2, 30d supply, fill #0

## 2021-07-19 ENCOUNTER — Other Ambulatory Visit: Payer: Self-pay

## 2021-07-27 DIAGNOSIS — J3081 Allergic rhinitis due to animal (cat) (dog) hair and dander: Secondary | ICD-10-CM | POA: Diagnosis not present

## 2021-07-27 DIAGNOSIS — J3089 Other allergic rhinitis: Secondary | ICD-10-CM | POA: Diagnosis not present

## 2021-07-27 DIAGNOSIS — J301 Allergic rhinitis due to pollen: Secondary | ICD-10-CM | POA: Diagnosis not present

## 2021-08-02 DIAGNOSIS — J301 Allergic rhinitis due to pollen: Secondary | ICD-10-CM | POA: Diagnosis not present

## 2021-08-02 DIAGNOSIS — J3081 Allergic rhinitis due to animal (cat) (dog) hair and dander: Secondary | ICD-10-CM | POA: Diagnosis not present

## 2021-08-02 DIAGNOSIS — J3089 Other allergic rhinitis: Secondary | ICD-10-CM | POA: Diagnosis not present

## 2021-08-16 DIAGNOSIS — J3081 Allergic rhinitis due to animal (cat) (dog) hair and dander: Secondary | ICD-10-CM | POA: Diagnosis not present

## 2021-08-16 DIAGNOSIS — J301 Allergic rhinitis due to pollen: Secondary | ICD-10-CM | POA: Diagnosis not present

## 2021-08-16 DIAGNOSIS — J3089 Other allergic rhinitis: Secondary | ICD-10-CM | POA: Diagnosis not present

## 2021-08-23 ENCOUNTER — Telehealth: Payer: Self-pay | Admitting: Certified Nurse Midwife

## 2021-08-23 NOTE — Telephone Encounter (Signed)
Unfortnetly we had to cancel your upcoming physical, we have added you to our recall list. you will recieve a call to get you rescheduled In the fall when we combine with [facility] to become a new practice [facility].

## 2021-08-31 DIAGNOSIS — J3089 Other allergic rhinitis: Secondary | ICD-10-CM | POA: Diagnosis not present

## 2021-08-31 DIAGNOSIS — J3081 Allergic rhinitis due to animal (cat) (dog) hair and dander: Secondary | ICD-10-CM | POA: Diagnosis not present

## 2021-08-31 DIAGNOSIS — J301 Allergic rhinitis due to pollen: Secondary | ICD-10-CM | POA: Diagnosis not present

## 2021-09-03 ENCOUNTER — Encounter: Payer: 59 | Admitting: Certified Nurse Midwife

## 2021-09-07 DIAGNOSIS — J3081 Allergic rhinitis due to animal (cat) (dog) hair and dander: Secondary | ICD-10-CM | POA: Diagnosis not present

## 2021-09-07 DIAGNOSIS — J3089 Other allergic rhinitis: Secondary | ICD-10-CM | POA: Diagnosis not present

## 2021-09-07 DIAGNOSIS — J301 Allergic rhinitis due to pollen: Secondary | ICD-10-CM | POA: Diagnosis not present

## 2021-09-13 DIAGNOSIS — J301 Allergic rhinitis due to pollen: Secondary | ICD-10-CM | POA: Diagnosis not present

## 2021-09-13 DIAGNOSIS — J3089 Other allergic rhinitis: Secondary | ICD-10-CM | POA: Diagnosis not present

## 2021-09-13 DIAGNOSIS — J3081 Allergic rhinitis due to animal (cat) (dog) hair and dander: Secondary | ICD-10-CM | POA: Diagnosis not present

## 2021-10-01 ENCOUNTER — Other Ambulatory Visit (HOSPITAL_COMMUNITY)
Admission: RE | Admit: 2021-10-01 | Discharge: 2021-10-01 | Disposition: A | Discharge status: Home / Self Care | Payer: 59 | Source: Ambulatory Visit | Attending: Certified Nurse Midwife | Admitting: Certified Nurse Midwife

## 2021-10-01 ENCOUNTER — Other Ambulatory Visit: Payer: Self-pay

## 2021-10-01 ENCOUNTER — Ambulatory Visit (INDEPENDENT_AMBULATORY_CARE_PROVIDER_SITE_OTHER): Payer: 59 | Admitting: Certified Nurse Midwife

## 2021-10-01 ENCOUNTER — Encounter: Payer: Self-pay | Admitting: Certified Nurse Midwife

## 2021-10-01 VITALS — BP 110/73 | HR 69 | Ht 67.0 in | Wt 131.7 lb

## 2021-10-01 DIAGNOSIS — Z01419 Encounter for gynecological examination (general) (routine) without abnormal findings: Secondary | ICD-10-CM

## 2021-10-01 DIAGNOSIS — Z124 Encounter for screening for malignant neoplasm of cervix: Secondary | ICD-10-CM | POA: Diagnosis not present

## 2021-10-01 MED ORDER — VALACYCLOVIR HCL 1 G PO TABS
1000.0000 mg | ORAL_TABLET | Freq: Two times a day (BID) | ORAL | 6 refills | Status: AC
  Filled 2021-10-01: qty 14, 7d supply, fill #0
  Filled 2022-01-25: qty 14, 7d supply, fill #1

## 2021-10-01 NOTE — Progress Notes (Signed)
**Note De-Identified Cindy Obfuscation** GYNECOLOGY ANNUAL PREVENTATIVE CARE ENCOUNTER NOTE  History:     Cindy Hart is a 26 y.o. G0P0000 female here for a routine annual gynecologic exam.  Current complaints: will get lower quadrant pain from time to time. Was told by previous provider likely ovarian cysts. Pt states that the pain resolves.   Denies abnormal vaginal bleeding, discharge, pelvic pain, problems with intercourse or other gynecologic concerns.     Social Relationship: married  Living: spouse  Work: Exercise:  Smoke/Alcohol/drug NFA:[id number] alcohol use.  Gynecologic History No LMP recorded (lmp unknown). (Menstrual status: IUD). Contraception: IUD Last Pap: 08/18/2018. Results were: normal  Last mammogram: n/a    Upstream - 10/01/21 1529       Pregnancy Intention Screening   Does the patient want to become pregnant in the next year? No    Does the patient's partner want to become pregnant in the next year? No    Would the patient like to discuss contraceptive options today? No            The pregnancy intention screening data noted above was reviewed. Potential methods of contraception were discussed. The patient elected to proceed with IUD.   Obstetric History OB History  Gravida Para Term Preterm AB Living  0 0 0 0 0 0  SAB IAB Ectopic Multiple Live Births  0 0 0 0 0    Past Medical History:  Diagnosis Date   Allergies    Anemia 2018   infusions - improved   Exercise-induced asthma    none recently   HSV-1 (herpes simplex virus 1) infection    Migraine headache    3-4x/wk   Motion sickness back seat cars   Wears contact lenses     Past Surgical History:  Procedure Laterality Date   TONSILLECTOMY N/A 05/08/2017   Procedure: TONSILLECTOMY;  Surgeon: Linus Salmons, MD;  Location: [facility];  Service: ENT;  Laterality: N/A;   WISDOM TOOTH EXTRACTION      Current Outpatient Medications on File Prior to Visit  Medication Sig Dispense Refill   albuterol  (VENTOLIN HFA) 108 (90 Base) MCG/ACT inhaler Inhale 2 puffs into the lungs every 4 (four) hours as needed.     EPINEPHrine (EPIPEN 2-PAK) 0.3 mg/0.3 mL IJ SOAJ injection as directed Injection As Directed 30 days 2 each 1   famotidine (PEPCID) 20 MG tablet Take 20 mg by mouth 2 (two) times daily.     fexofenadine (ALLEGRA ALLERGY) 180 MG tablet Take 2 tablets daily for 2 weeks. Then decrease to 1 tablet daily. 60 tablet 2   fluticasone (FLONASE) 50 MCG/ACT nasal spray Place 2 sprays into both nostrils daily. 16 g 6   levocetirizine (XYZAL) 5 MG tablet SMARTSIG:1 Tablet(s) By Mouth Every Evening     meloxicam (MOBIC) 7.5 MG tablet Take 1 tablet every day by oral route. 30 tablet 3   No current facility-administered medications on file prior to visit.    Allergies  Allergen Reactions   Feraheme [Ferumoxytol] Anaphylaxis    Social History:  reports that she has never smoked. She has never used smokeless tobacco. She reports current alcohol use. She reports that she does not use drugs.  Family History  Problem Relation Age of Onset   Thyroid disease Mother    Diabetes Maternal Grandmother    Heart disease Maternal Grandmother    Hypertension Maternal Grandfather    Atrial fibrillation Paternal Grandmother     The following portions of the patient's  history were reviewed and updated as appropriate: allergies, current medications, past family history, past medical history, past social history, past surgical history and problem list.  Review of Systems Pertinent items noted in HPI and remainder of comprehensive ROS otherwise negative.  Physical Exam:  BP 110/73   Pulse 69   Ht 5\' 7"  (1.702 m)   Wt 131 lb 11.2 oz (59.7 kg)   LMP  (LMP Unknown)   BMI 20.63 kg/m  CONSTITUTIONAL: Well-developed, well-nourished female in no acute distress.  HENT:  Normocephalic, atraumatic, External right and left ear normal. Oropharynx is clear and moist EYES: Conjunctivae and EOM are normal. Pupils are  equal, round, and reactive to light. No scleral icterus.  NECK: Normal range of motion, supple, no masses.  Normal thyroid.  SKIN: Skin is warm and dry. No rash noted. Not diaphoretic. No erythema. No pallor. MUSCULOSKELETAL: Normal range of motion. No tenderness.  No cyanosis, clubbing, or edema.  2+ distal pulses. NEUROLOGIC: Alert and oriented to person, place, and time. Normal reflexes, muscle tone coordination.  PSYCHIATRIC: Normal mood and affect. Normal behavior. Normal judgment and thought content. CARDIOVASCULAR: Normal heart rate noted, regular rhythm RESPIRATORY: Clear to auscultation bilaterally. Effort and breath sounds normal, no problems with respiration noted. BREASTS: Symmetric in size. No masses, tenderness, skin changes, nipple drainage, or lymphadenopathy bilaterally.  ABDOMEN: Soft, no distention noted.  No tenderness, rebound or guarding.  PELVIC: Normal appearing external genitalia and urethral meatus; normal appearing vaginal mucosa and cervix.  No abnormal discharge noted.  Pap smear obtained.  Contact bleeding present. IUD strings present. Normal uterine size, no other palpable masses, no uterine or adnexal tenderness.  .   Assessment and Plan:    1. Well woman exam with routine gynecological exam   Pap:Will follow up results of pap smear and manage accordingly Mammogram : n/a  Labs: declines Refills: valtrex for oral HSV Referral: none  Discussed u/s prn for lower quadrant pain should pain not resolve or become severe ( not managed with tylenol/ibuprofen).  Routine preventative health maintenance measures emphasized. Please refer to After Visit Summary for other counseling recommendations.      , CNM [facility],  [facility]

## 2021-10-05 LAB — CYTOLOGY - PAP: Diagnosis: NEGATIVE

## 2022-01-25 ENCOUNTER — Encounter: Payer: Self-pay | Admitting: Oncology

## 2022-01-25 ENCOUNTER — Other Ambulatory Visit: Payer: Self-pay

## 2022-02-14 ENCOUNTER — Encounter: Payer: Self-pay | Admitting: Oncology

## 2022-02-16 ENCOUNTER — Encounter: Payer: Self-pay | Admitting: Oncology

## 2022-11-26 ENCOUNTER — Encounter: Payer: Self-pay | Admitting: Certified Nurse Midwife

## 2022-11-26 ENCOUNTER — Ambulatory Visit (INDEPENDENT_AMBULATORY_CARE_PROVIDER_SITE_OTHER): Payer: BC Managed Care – PPO | Admitting: Certified Nurse Midwife

## 2022-11-26 ENCOUNTER — Encounter: Payer: Self-pay | Admitting: Oncology

## 2022-11-26 VITALS — BP 110/70 | HR 63 | Ht 65.5 in | Wt 136.5 lb

## 2022-11-26 DIAGNOSIS — Z01419 Encounter for gynecological examination (general) (routine) without abnormal findings: Secondary | ICD-10-CM

## 2022-11-26 NOTE — Progress Notes (Signed)
GYNECOLOGY ANNUAL PREVENTATIVE CARE ENCOUNTER NOTE  History:     Cindy Hart is a 27 y.o. G0P0000 female here for a routine annual gynecologic exam.  Current complaints: none.   Denies abnormal vaginal bleeding, discharge, pelvic pain, problems with intercourse or other gynecologic concerns.     Social Relationship:married  Living: with spouse  Work: Geographical information systems officer Psychologist, forensic) Exercise: at work Smoke/Alcohol/drug use: occasional alcohol use, denies smoking &vaping, drug use.   Gynecologic History No LMP recorded. (Menstrual status: IUD). Contraception: IUD Last Pap: 10/01/21. Results were: normal  Last mammogram: n/a .    Upstream - 11/26/22 0836       Pregnancy Intention Screening   Does the patient want to become pregnant in the next year? No    Does the patient's partner want to become pregnant in the next year? No    Would the patient like to discuss contraceptive options today? No      Contraception Wrap Up   Current Method IUD or IUS    End Method IUD or IUS    Contraception Counseling Provided No            The pregnancy intention screening data noted above was reviewed. Potential methods of contraception were discussed. The patient elected to proceed with IUD or IUS.   Obstetric History OB History  Gravida Para Term Preterm AB Living  0 0 0 0 0 0  SAB IAB Ectopic Multiple Live Births  0 0 0 0 0    Past Medical History:  Diagnosis Date   Allergies    Anemia 2018   infusions - improved   Exercise-induced asthma    none recently   HSV-1 (herpes simplex virus 1) infection    Migraine headache    3-4x/wk   Motion sickness back seat cars   Wears contact lenses     Past Surgical History:  Procedure Laterality Date   TONSILLECTOMY N/A 05/08/2017   Procedure: TONSILLECTOMY;  Surgeon: Linus Salmons, MD;  Location: [facility];  Service: ENT;  Laterality: N/A;   WISDOM TOOTH EXTRACTION      Current Outpatient Medications on  File Prior to Visit  Medication Sig Dispense Refill   albuterol (VENTOLIN HFA) 108 (90 Base) MCG/ACT inhaler Inhale 2 puffs into the lungs every 4 (four) hours as needed.     EPINEPHrine (EPIPEN 2-PAK) 0.3 mg/0.3 mL IJ SOAJ injection as directed Injection As Directed 30 days 2 each 1   famotidine (PEPCID) 20 MG tablet Take 20 mg by mouth 2 (two) times daily.     fexofenadine (ALLEGRA ALLERGY) 180 MG tablet Take 2 tablets daily for 2 weeks. Then decrease to 1 tablet daily. 60 tablet 2   fluticasone (FLONASE) 50 MCG/ACT nasal spray Place 2 sprays into both nostrils daily. 16 g 6   levocetirizine (XYZAL) 5 MG tablet SMARTSIG:1 Tablet(s) By Mouth Every Evening     meloxicam (MOBIC) 7.5 MG tablet Take 1 tablet every day by oral route. 30 tablet 3   No current facility-administered medications on file prior to visit.    Allergies  Allergen Reactions   Feraheme [Ferumoxytol] Anaphylaxis    Social History:  reports that she has never smoked. She has never used smokeless tobacco. She reports current alcohol use. She reports that she does not use drugs.  Family History  Problem Relation Age of Onset   Thyroid disease Mother    Diabetes Maternal Grandmother    Heart disease Maternal Grandmother  Hypertension Maternal Grandfather    Atrial fibrillation Paternal Grandmother     The following portions of the patient's history were reviewed and updated as appropriate: allergies, current medications, past family history, past medical history, past social history, past surgical history and problem list.  Review of Systems Pertinent items noted in HPI and remainder of comprehensive ROS otherwise negative.  Physical Exam:  BP 110/70   Pulse 63   Ht 5' 5.5" (1.664 m)   Wt 136 lb 8 oz (61.9 kg)   BMI 22.37 kg/m  CONSTITUTIONAL: Well-developed, well-nourished female in no acute distress.  HENT:  Normocephalic, atraumatic, External right and left ear normal. Oropharynx is clear and moist EYES:  Conjunctivae and EOM are normal. Pupils are equal, round, and reactive to light. No scleral icterus.  NECK: Normal range of motion, supple, no masses.  Normal thyroid.  SKIN: Skin is warm and dry. No rash noted. Not diaphoretic. No erythema. No pallor. MUSCULOSKELETAL: Normal range of motion. No tenderness.  No cyanosis, clubbing, or edema.  2+ distal pulses. NEUROLOGIC: Alert and oriented to person, place, and time. Normal reflexes, muscle tone coordination.  PSYCHIATRIC: Normal mood and affect. Normal behavior. Normal judgment and thought content. CARDIOVASCULAR: Normal heart rate noted, regular rhythm RESPIRATORY: Clear to auscultation bilaterally. Effort and breath sounds normal, no problems with respiration noted. BREASTS: Symmetric in size. No masses, tenderness, skin changes, nipple drainage, or lymphadenopathy bilaterally.  ABDOMEN: Soft, no distention noted.  No tenderness, rebound or guarding.  PELVIC: Normal appearing external genitalia and urethral meatus; normal appearing vaginal mucosa and cervix.  No abnormal discharge noted.  Pap smear not due . IUD strings present.  Normal uterine size, no other palpable masses, no uterine or adnexal tenderness.  .   Assessment and Plan:    1. Women's annual routine gynecological examination  Pap: not due  Mammogram : n/a  Labs: none  Refills: none  Referral: none  Discussed lab work- she declines at this time. Lipid and Tsh current. She will send my chart message should she want to have done. Then will have done with lab appointment.  Routine preventative health maintenance measures emphasized. Please refer to After Visit Summary for other counseling recommendations.      Doreene Burke, CNM [facility]  [facility],  [facility]

## 2022-11-29 ENCOUNTER — Other Ambulatory Visit: Payer: Self-pay

## 2022-11-29 ENCOUNTER — Encounter: Payer: Self-pay | Admitting: Oncology

## 2022-11-29 ENCOUNTER — Ambulatory Visit
Admission: RE | Admit: 2022-11-29 | Discharge: 2022-11-29 | Disposition: A | Discharge status: Home / Self Care | Payer: BC Managed Care – PPO | Source: Ambulatory Visit

## 2022-11-29 VITALS — BP 106/70 | HR 77 | Temp 99.0°F | Resp 18

## 2022-11-29 DIAGNOSIS — S0991XA Unspecified injury of ear, initial encounter: Secondary | ICD-10-CM | POA: Diagnosis not present

## 2022-11-29 MED ORDER — OFLOXACIN 0.3 % OT SOLN
10.0000 [drp] | Freq: Every day | OTIC | 0 refills | Status: AC
  Filled 2022-11-29: qty 5, 10d supply, fill #0

## 2022-11-29 NOTE — Discharge Instructions (Signed)
Today you are evaluated for your ear injury, on exam able to see dried blood on the eardrum but it is not punctured or busted, there is no abnormalities to the ear canal or external ear  Due to there being dried blood within the ear we will prophylactically place you on eardrops to keep ear from becoming infected  Place 10 drops into the right ear every morning for 7 days  Avoid ear cleaning or placing objects within the ear canal to prevent further irritation  You may use Tylenol or Motrin as needed for achiness as well as warm compresses  If you continue to have decreased hearing please follow-up with the ear nose and throat specialist for reevaluation, information listed on front page

## 2022-11-29 NOTE — ED Triage Notes (Signed)
Pt was hit in her right ear yesterday during gymnastics class. She reports losing hearing for a short time. Today her hearing is muffled.

## 2022-11-29 NOTE — ED Provider Notes (Signed)
Renaldo Fiddler    CSN: [id number] Arrival date & time: 11/29/22  1057      History   Chief Complaint Chief Complaint  Patient presents with   Ear Injury    Entered by patient    HPI KIERRAH KILBRIDE is a 27 y.o. female.   Patient presents for evaluation of decreased hearing, intermittent pain described as achiness getting 1 day ago after injury to the right ear.  Endorses that she was kicked in the ear during gymnastics practice, immediately experienced pain and hearing loss.  Has improved but has not resolved.  Has sensation of fluid within the ear but did not see or feel drainage.  Denies ear ringing, congestion or fever.  Past Medical History:  Diagnosis Date   Allergies    Anemia 2018   infusions - improved   Exercise-induced asthma    none recently   HSV-1 (herpes simplex virus 1) infection    Migraine headache    3-4x/wk   Motion sickness back seat cars   Wears contact lenses     Patient Active Problem List   Diagnosis Date Noted   ANA positive 08/08/2020   Vitamin D deficiency 08/02/2020   History of Lyme disease 08/02/2020   IUD (intrauterine device) in place 11/19/2019   Iron deficiency anemia 04/10/2016   Asthma, intermittent 12/05/2014    Past Surgical History:  Procedure Laterality Date   TONSILLECTOMY N/A 05/08/2017   Procedure: TONSILLECTOMY;  Surgeon: Linus Salmons, MD;  Location: [facility];  Service: ENT;  Laterality: N/A;   WISDOM TOOTH EXTRACTION      OB History     Gravida  0   Para  0   Term  0   Preterm  0   AB  0   Living  0      SAB  0   IAB  0   Ectopic  0   Multiple  0   Live Births  0            Home Medications    Prior to Admission medications   Medication Sig Start Date End Date Taking? Authorizing Provider  levonorgestrel (MIRENA) 20 MCG/DAY IUD 1 each by Intrauterine route once.   Yes [provider]  ofloxacin (FLOXIN) 0.3 % OTIC solution Place 10 drops into the right ear  daily. 11/29/22  Yes Alfonza Toft R, NP  albuterol (VENTOLIN HFA) 108 (90 Base) MCG/ACT inhaler Inhale 2 puffs into the lungs every 4 (four) hours as needed. 07/30/19   [provider]  EPINEPHrine (EPIPEN 2-PAK) 0.3 mg/0.3 mL IJ SOAJ injection as directed Injection As Directed 30 days 07/13/21     famotidine (PEPCID) 20 MG tablet Take 20 mg by mouth 2 (two) times daily.    [provider]  fexofenadine (ALLEGRA ALLERGY) 180 MG tablet Take 2 tablets daily for 2 weeks. Then decrease to 1 tablet daily. 11/10/18   Guse, Janna Arch, FNP  fluticasone (FLONASE) 50 MCG/ACT nasal spray Place 2 sprays into both nostrils daily. 04/07/21   Bennie Pierini, FNP  levocetirizine (XYZAL) 5 MG tablet SMARTSIG:1 Tablet(s) By Mouth Every Evening 07/19/19   [provider]  meloxicam (MOBIC) 7.5 MG tablet Take 1 tablet every day by oral route. 03/14/21       Family History Family History  Problem Relation Age of Onset   Thyroid disease Mother    Diabetes Maternal Grandmother    Heart disease Maternal Grandmother    Hypertension Maternal  Grandfather    Atrial fibrillation Paternal Grandmother     Social History Social History   Tobacco Use   Smoking status: Never   Smokeless tobacco: Never  Vaping Use   Vaping status: Never Used  Substance Use Topics   Alcohol use: Yes    Alcohol/week: 0.0 standard drinks of alcohol    Comment: socially    Drug use: No     Allergies   Feraheme [ferumoxytol]   Review of Systems Review of Systems   Physical Exam Triage Vital Signs ED Triage Vitals  Encounter Vitals Group     BP 11/29/22 1111 106/70     Systolic BP Percentile --      Diastolic BP Percentile --      Pulse Rate 11/29/22 1111 77     Resp 11/29/22 1111 18     Temp 11/29/22 1111 99 F (37.2 C)     Temp Source 11/29/22 1111 Oral     SpO2 11/29/22 1111 96 %     Weight --      Height --      Head Circumference --      Peak Flow --      Pain Score 11/29/22 1110 0      Pain Loc --      Pain Education --      Exclude from Growth Chart --    No data found.  Updated Vital Signs BP 106/70 (BP Location: Left Arm)   Pulse 77   Temp 99 F (37.2 C) (Oral)   Resp 18   SpO2 96%   Visual Acuity Right Eye Distance:   Left Eye Distance:   Bilateral Distance:    Right Eye Near:   Left Eye Near:    Bilateral Near:     Physical Exam Constitutional:      Appearance: Normal appearance.  HENT:     Left Ear: Tympanic membrane, ear canal and external ear normal.     Ears:     Comments: Scant dried bright red blood on the right tympanic membrane, tympanic membrane intact, no erythema noted, no abnormality to the ear canal or external ear Eyes:     Extraocular Movements: Extraocular movements intact.  Pulmonary:     Effort: Pulmonary effort is normal.  Neurological:     Mental Status: She is alert and oriented to person, place, and time.      UC Treatments / Results  Labs (all labs ordered are listed, but only abnormal results are displayed) Labs Reviewed - No data to display  EKG   Radiology No results found.  Procedures Procedures (including critical care time)  Medications Ordered in UC Medications - No data to display  Initial Impression / Assessment and Plan / UC Course  I have reviewed the triage vital signs and the nursing notes.  Pertinent labs & imaging results that were available during my care of the patient were reviewed by me and considered in my medical decision making (see chart for details).  Right ear injury, initial encounter  Exam findings with patient, no perforation noted, as there is scant blood within the ear prophylactically placed on ofloxacin, discussed administration, advised against ear cleaning and may use over-the-counter analgesics and warm compresses for management of pain, advised to monitor and given a walker referral to ear nose and throat symptoms continue to persist Final Clinical Impressions(s) /  UC Diagnoses   Final diagnoses:  Right ear injury, initial encounter     Discharge Instructions  Today you are evaluated for your ear injury, on exam able to see dried blood on the eardrum but it is not punctured or busted, there is no abnormalities to the ear canal or external ear  Due to there being dried blood within the ear we will prophylactically place you on eardrops to keep ear from becoming infected  Place 10 drops into the right ear every morning for 7 days  Avoid ear cleaning or placing objects within the ear canal to prevent further irritation  You may use Tylenol or Motrin as needed for achiness as well as warm compresses  If you continue to have decreased hearing please follow-up with the ear nose and throat specialist for reevaluation, information listed on front page   ED Prescriptions     Medication Sig Dispense Auth. Provider   ofloxacin (FLOXIN) 0.3 % OTIC solution Place 10 drops into the right ear daily. 5 mL Valinda Hoar, NP      PDMP not reviewed this encounter.   Valinda Hoar, NP 11/29/22 1123

## 2023-03-03 ENCOUNTER — Encounter: Payer: Self-pay | Admitting: Oncology

## 2024-05-04 ENCOUNTER — Ambulatory Visit: Payer: Self-pay | Admitting: Certified Nurse Midwife
